# Patient Record
Sex: Female | Born: 1975 | Race: Black or African American | Hispanic: No | Marital: Married | Smoking: Never smoker
Health system: Southern US, Community
[De-identification: ages and names within clinical notes are randomized; demographics above are authoritative.]

## PROBLEM LIST (undated history)

## (undated) ENCOUNTER — Inpatient Hospital Stay (HOSPITAL_COMMUNITY): Payer: Self-pay

## (undated) DIAGNOSIS — O134 Gestational [pregnancy-induced] hypertension without significant proteinuria, complicating childbirth: Secondary | ICD-10-CM

## (undated) DIAGNOSIS — Z789 Other specified health status: Secondary | ICD-10-CM

## (undated) DIAGNOSIS — B009 Herpesviral infection, unspecified: Secondary | ICD-10-CM

## (undated) DIAGNOSIS — N809 Endometriosis, unspecified: Secondary | ICD-10-CM

## (undated) HISTORY — PX: LAPAROSCOPY: SHX197

## (undated) HISTORY — PX: HYSTEROSCOPY: SHX211

---

## 2005-08-15 ENCOUNTER — Other Ambulatory Visit: Admission: RE | Admit: 2005-08-15 | Discharge: 2005-08-15 | Payer: Self-pay | Admitting: Obstetrics and Gynecology

## 2005-09-06 ENCOUNTER — Encounter (INDEPENDENT_AMBULATORY_CARE_PROVIDER_SITE_OTHER): Payer: Self-pay | Admitting: *Deleted

## 2005-09-06 ENCOUNTER — Ambulatory Visit (HOSPITAL_COMMUNITY): Admission: RE | Admit: 2005-09-06 | Discharge: 2005-09-06 | Payer: Self-pay | Admitting: Obstetrics and Gynecology

## 2010-07-13 ENCOUNTER — Ambulatory Visit (HOSPITAL_COMMUNITY)
Admission: RE | Admit: 2010-07-13 | Discharge: 2010-07-13 | Payer: Self-pay | Source: Home / Self Care | Attending: Obstetrics and Gynecology | Admitting: Obstetrics and Gynecology

## 2010-12-01 NOTE — Op Note (Signed)
Meagan Davis, Meagan Davis              ACCOUNT NO.:  [id number]   MEDICAL RECORD NO.:  [id number]          PATIENT TYPE:  AMB   LOCATION:  SDC                           FACILITY:  [facility]   PHYSICIAN:  Carrington Clamp, M.D. DATE OF BIRTH:  Mar 01, 1976   DATE OF PROCEDURE:  09/06/2005  DATE OF DISCHARGE:                                 OPERATIVE REPORT   PREOPERATIVE DIAGNOSIS:  Dysmenorrhea and menorrhagia.   POSTOPERATIVE DIAGNOSES:  1.  Stage IV endometriosis.  2.  Large endometrioma.   PROCEDURE:  Diagnostic laparoscopy, lysis of adhesions and removal of  endometrioma.   SURGEON:  Carrington Clamp, M.D.   ASSISTANT:  Randye Lobo, M.D.   ANESTHESIA:  General.   SPECIMENS:  Endometrioma.   ESTIMATED BLOOD LOSS:  Minimal.   INTRAVENOUS FLUIDS:  ________________   URINE OUTPUT:  Not measured.   COMPLICATIONS:  None.   FINDINGS:  Stage IV endometriosis with lesions as follows.  On entry with  the scope, a large 6 cm endometrioma was seen, and this was identified as  coming from the left paratubal region.  This was ruptured during the  procedure, and chocolate cyst fluid emptied from it. There were filmy and  fibrous adhesions of the left tube to the posterior fundus of the uterus.  The cul-de-sac was initially completely obliterated with small endometriotic  cysts, fibrous and filmy adhesions and frank endometriosis.  There was a  small 2 mm spot of endometriosis and clear plaques seen in the midportion of  the appendix but no other abnormalities of the appendix.  The liver edge was  normal.  Dissection of the cul-de-sac revealed the ovaries stuck bilaterally  to the broad ligament but otherwise appearing normal with the exception of a  small 3 cm solid cyst seen on the right ovary that was consistent with a  dermoid but otherwise appeared normal.  The dissection of the cul-de-sac  revealed a large amount of scar tissue in the bowel was out of the field of  dissection.   Blunt dissection took place along the posterior edge of the  uterus and cervix.  The ureters were seen coursing bilaterally and out of  the field of dissection.  There was even endometriotic plaques and cyst seen  on the left portion of the anterior cul-de-sac.  The ends of the tubes were  unable to be seen as they were frankly adhesed, but there were closest to  the bowel and it was decided not to probe further.   DESCRIPTION OF PROCEDURE:  After adequate general anesthesia was achieved,  the patient prepped and draped in a sterile fashion in dorsal lithotomy  position.  Speculum was placed in the vagina and after the cervix was  dilated with a dilator, the uterine manipulator was placed in the uterus.  The 10 mm incision was made infraumbilically and the Veress needle passed  into the abdomen without aspiration of bowel contents or blood.  The abdomen  was insufflated and the 10 mm trocar placed without complication.  After the  scope had been placed, two 5 mm trocars bilaterally  to the insertion of the  round ligament.  Careful detailed inspection was performed, and the above  findings were noted.  The endometrioma was grasped and emptied of its  contents.  It was then able to be removed with triple polar cautery as it  was well out of the field of dissection.  Blunt dissection was used to  expose the cul-de-sac along the posterior fundus and cervix.  The ovaries  were unable to be mobilized but were able to be inspected.  Irrigation was  performed and although there were some raw surfaces in the dissection of the  cul-de-sac, there was no frank bleeding. After all the findings had been  noted a piece of Interceed was placed in the cul-de-sac over the ovaries to  not only aid with bleeding but also discourage formation of adhesions.  All  instruments were then withdrawn from the abdomen.  The abdomen desufflated.  The 10 mm trocar fascia was closed with a figure-of-eight stitch of 2-0   Vicryl.  The 5 mm subcutaneous incisions were closed with through-and-  through stitch of 3-0 Vicryl and Rapide.  The skin was closed with  Dermabond.  All instruments were withdrawn for the vagina.  The patient  tolerated the procedure well.  She returned to the recovery room in stable  condition.      Carrington Clamp, M.D.  Electronically Signed     MH/MEDQ  D:  09/06/2005  T:  09/07/2005  Job:  [id number]

## 2011-06-20 ENCOUNTER — Other Ambulatory Visit: Payer: Self-pay | Admitting: Obstetrics and Gynecology

## 2011-06-20 DIAGNOSIS — M542 Cervicalgia: Secondary | ICD-10-CM

## 2011-06-20 DIAGNOSIS — M25519 Pain in unspecified shoulder: Secondary | ICD-10-CM

## 2011-06-23 ENCOUNTER — Other Ambulatory Visit: Payer: Self-pay

## 2013-12-02 ENCOUNTER — Other Ambulatory Visit: Payer: Self-pay | Admitting: Obstetrics and Gynecology

## 2016-07-20 ENCOUNTER — Other Ambulatory Visit: Payer: Self-pay | Admitting: Obstetrics and Gynecology

## 2016-07-24 LAB — CYTOLOGY - PAP

## 2017-07-16 HISTORY — PX: MYOMECTOMY: SHX85

## 2018-05-12 ENCOUNTER — Inpatient Hospital Stay (HOSPITAL_COMMUNITY)
Admission: AD | Admit: 2018-05-12 | Discharge: 2018-05-12 | Disposition: A | Discharge status: Home / Self Care | Payer: BLUE CROSS/BLUE SHIELD | Source: Ambulatory Visit | Attending: Obstetrics | Admitting: Obstetrics

## 2018-05-12 ENCOUNTER — Encounter (HOSPITAL_COMMUNITY): Payer: Self-pay | Admitting: *Deleted

## 2018-05-12 ENCOUNTER — Other Ambulatory Visit: Payer: Self-pay

## 2018-05-12 ENCOUNTER — Inpatient Hospital Stay (HOSPITAL_BASED_OUTPATIENT_CLINIC_OR_DEPARTMENT_OTHER): Payer: BLUE CROSS/BLUE SHIELD

## 2018-05-12 DIAGNOSIS — O3412 Maternal care for benign tumor of corpus uteri, second trimester: Secondary | ICD-10-CM | POA: Insufficient documentation

## 2018-05-12 DIAGNOSIS — D259 Leiomyoma of uterus, unspecified: Secondary | ICD-10-CM | POA: Insufficient documentation

## 2018-05-12 DIAGNOSIS — O418X20 Other specified disorders of amniotic fluid and membranes, second trimester, not applicable or unspecified: Secondary | ICD-10-CM | POA: Diagnosis not present

## 2018-05-12 DIAGNOSIS — N809 Endometriosis, unspecified: Secondary | ICD-10-CM | POA: Insufficient documentation

## 2018-05-12 DIAGNOSIS — Z79899 Other long term (current) drug therapy: Secondary | ICD-10-CM | POA: Diagnosis not present

## 2018-05-12 DIAGNOSIS — O4692 Antepartum hemorrhage, unspecified, second trimester: Secondary | ICD-10-CM

## 2018-05-12 DIAGNOSIS — Z3A16 16 weeks gestation of pregnancy: Secondary | ICD-10-CM

## 2018-05-12 DIAGNOSIS — O468X2 Other antepartum hemorrhage, second trimester: Secondary | ICD-10-CM

## 2018-05-12 HISTORY — DX: Endometriosis, unspecified: N80.9

## 2018-05-12 HISTORY — DX: Other specified health status: Z78.9

## 2018-05-12 LAB — CBC
HEMATOCRIT: 33.8 % — AB (ref 36.0–46.0)
HEMOGLOBIN: 11.5 g/dL — AB (ref 12.0–15.0)
MCH: 30.3 pg (ref 26.0–34.0)
MCHC: 34 g/dL (ref 30.0–36.0)
MCV: 89.2 fL (ref 80.0–100.0)
NRBC: 0 % (ref 0.0–0.2)
Platelets: 203 10*3/uL (ref 150–400)
RBC: 3.79 MIL/uL — ABNORMAL LOW (ref 3.87–5.11)
RDW: 16.3 % — AB (ref 11.5–15.5)
WBC: 5.2 10*3/uL (ref 4.0–10.5)

## 2018-05-12 LAB — ABO/RH: ABO/RH(D): O POS

## 2018-05-12 NOTE — MAU Note (Signed)
About 0900, noted some real dark brown, reddish on panties, noting when wiped.  Happened again about 3 wks ago.    Hx of fibroids.  Last Wed, started having pain in her vagina- heaviness or pulling sensation. Called office, was told to come here. No pain

## 2018-05-12 NOTE — MAU Note (Signed)
Urine in lab 

## 2018-05-12 NOTE — Discharge Instructions (Signed)
Pelvic Rest °Pelvic rest may be recommended if: °· Your placenta is partially or completely covering the opening of your cervix (placenta previa). °· There is bleeding between the wall of the uterus and the amniotic sac in the first trimester of pregnancy (subchorionic hemorrhage). °· You went into labor too early (preterm labor). ° °Based on your overall health and the health of your baby, your health care provider will decide if pelvic rest is right for you. °How do I rest my pelvis? °For as long as told by your health care provider: °· Do not have sex, sexual stimulation, or an orgasm. °· Do not use tampons. Do not douche. Do not put anything in your vagina. °· Do not lift anything that is heavier than 10 lb (4.5 kg). °· Avoid activities that take a lot of effort (are strenuous). °· Avoid any activity in which your pelvic muscles could become strained. ° °When should I seek medical care? °Seek medical care if you have: °· Cramping pain in your lower abdomen. °· Vaginal discharge. °· A low, dull backache. °· Regular contractions. °· Uterine tightening. ° °When should I seek immediate medical care? °Seek immediate medical care if: °· You have vaginal bleeding and you are pregnant. ° °This information is not intended to replace advice given to you by your health care provider. Make sure you discuss any questions you have with your health care provider. °Document Released: 10/27/2010 Document Revised: 12/08/2015 Document Reviewed: 01/03/2015 °Elsevier Interactive Patient Education © 2018 Elsevier Inc. ° °Subchorionic Hematoma °A subchorionic hematoma is a gathering of blood between the outer wall of the placenta and the inner wall of the womb (uterus). The placenta is the organ that connects the fetus to the wall of the uterus. The placenta performs the feeding, breathing (oxygen to the fetus), and waste removal (excretory work) of the fetus. °Subchorionic hematoma is the most common abnormality found on a result  from ultrasonography done during the first trimester or early second trimester of pregnancy. If there has been little or no vaginal bleeding, early small hematomas usually shrink on their own and do not affect your baby or pregnancy. The blood is gradually absorbed over 1-2 weeks. When bleeding starts later in pregnancy or the hematoma is larger or occurs in an older pregnant woman, the outcome may not be as good. Larger hematomas may get bigger, which increases the chances for miscarriage. Subchorionic hematoma also increases the risk of premature detachment of the placenta from the uterus, preterm (premature) labor, and stillbirth. °Follow these instructions at home: °· Stay on bed rest if your health care provider recommends this. Although bed rest will not prevent more bleeding or prevent a miscarriage, your health care provider may recommend bed rest until you are advised otherwise. °· Avoid heavy lifting (more than 10 lb [4.5 kg]), exercise, sexual intercourse, or douching as directed by your health care provider. °· Keep track of the number of pads you use each day and how soaked (saturated) they are. Write down this information. °· Do not use tampons. °· Keep all follow-up appointments as directed by your health care provider. Your health care provider may ask you to have follow-up blood tests or ultrasound tests or both. °Get help right away if: °· You have severe cramps in your stomach, back, abdomen, or pelvis. °· You have a fever. °· You pass large clots or tissue. Save any tissue for your health care provider to look at. °· Your bleeding increases or you become lightheaded,   feel weak, or have fainting episodes. °This information is not intended to replace advice given to you by your health care provider. Make sure you discuss any questions you have with your health care provider. °Document Released: 10/17/2006 Document Revised: 12/08/2015 Document Reviewed: 01/29/2013 °Elsevier Interactive Patient  Education © 2017 Elsevier Inc. ° °

## 2018-05-12 NOTE — MAU Provider Note (Signed)
History     CSN: [id number]  Arrival date and time: 05/12/18 1154   First Provider Initiated Contact with Patient 05/12/18 1323      Chief Complaint  Patient presents with  . Vaginal Bleeding   HPI  Ms.Meagan Davis is a 42 y.o. female G1P0 @ [redacted]w[redacted]d with a history of fibroids here in MAU with complaints of vaginal bleeding. This is the second time in the pregnancy she has had bleeding. This episode of vaginal bleeding started today. The bleeding is enough for her to wear a pad, however when she checked the pad it did not have any blood on it. The blood is very dark brown. No clots or active bright red bleeding. Some pelvic pressure related with movement, no pain.   OB History    Gravida  1   Para      Term      Preterm      AB      Living        SAB      TAB      Ectopic      Multiple      Live Births              Past Medical History:  Diagnosis Date  . Endometriosis   . Medical history non-contributory     Past Surgical History:  Procedure Laterality Date  . MYOMECTOMY  2019    Family History  Problem Relation Age of Onset  . Healthy Mother   . Healthy Father     Social History   Tobacco Use  . Smoking status: Never Smoker  . Smokeless tobacco: Never Used  Substance Use Topics  . Alcohol use: Not Currently    Comment: Social  . Drug use: Never    Allergies: No Known Allergies  Medications Prior to Admission  Medication Sig Dispense Refill Last Dose  . docusate sodium (COLACE) 100 MG capsule Take 100 mg by mouth daily.   05/11/2018 at Unknown time  . Prenatal Vit-Fe Fumarate-FA (PRENATAL MULTIVITAMIN) TABS tablet Take 1 tablet by mouth daily at 12 noon.   05/12/2018 at Unknown time   Results for orders placed or performed during the hospital encounter of 05/12/18 (from the past 48 hour(s))  ABO/Rh     Status: None   Collection Time: 05/12/18  1:35 PM  Result Value Ref Range   ABO/RH(D)      O POS Performed at [facility],  [address], [place], [place] [address]   CBC     Status: Abnormal   Collection Time: 05/12/18  1:35 PM  Result Value Ref Range   WBC 5.2 4.0 - 10.5 K/uL   RBC 3.79 (L) 3.87 - 5.11 MIL/uL   Hemoglobin 11.5 (L) 12.0 - 15.0 g/dL   HCT 33.8 (L) 36.0 - 46.0 %   MCV 89.2 80.0 - 100.0 fL   MCH 30.3 26.0 - 34.0 pg   MCHC 34.0 30.0 - 36.0 g/dL   RDW 16.3 (H) 11.5 - 15.5 %   Platelets 203 150 - 400 K/uL   nRBC 0.0 0.0 - 0.2 %    Comment: Performed at [facility], [address] [place], [place] [address]    [place] Mfm Ob Limited  Result Date: 05/12/2018 ----------------------------------------------------------------------  OBSTETRICS REPORT                       (Signed Final 05/12/2018 03:30 pm) ---------------------------------------------------------------------- Patient Info  ID #:  [id number]                          D.O.B.:  [birth year] (42 yrs)  Name:       Meagan Davis                    Visit Date: 05/12/2018 02:56 pm ---------------------------------------------------------------------- Performed By  Performed By:     Novella Rob        Ref. Address:     [address] [place],                                                             [place] [address]  Attending:        Tama High MD        Location:         [facility]  Referred By:      Lezlie Lye NP ---------------------------------------------------------------------- Orders   #  Description                          Code         Ordered By   1  [place] MFM OB LIMITED                    [phone]     [facility]  ----------------------------------------------------------------------   #  Order #                    Accession #                 Episode #   1  [id number]                   [id number]                  [id number]  ---------------------------------------------------------------------- Indications   Vaginal  bleeding in pregnancy, second          O46.92   trimester (dark brown on panties, nothing   when wiped)   Uterine fibroids affecting pregnancy in        O34.12, D25.9   second trimester, antepartum   [redacted] weeks gestation of pregnancy                Z3A.16  ---------------------------------------------------------------------- Vital Signs  Weight (lb): 255 ---------------------------------------------------------------------- Fetal Evaluation  Num Of Fetuses:         1  Fetal Heart Rate(bpm):  144  Cardiac Activity:       Observed  Presentation:           Cephalic  Placenta:  Anterior  Amniotic Fluid  AFI FV:      Within normal limits                              Largest Pocket(cm)                              4.21  Comment:    Small subchorionic hemorrhage noted. ---------------------------------------------------------------------- OB History  Gravidity:    1 ---------------------------------------------------------------------- Gestational Age  Best:          16w 4d     Det. By:  Previous Ultrasound      EDD:   10/23/18 ---------------------------------------------------------------------- Cervix Uterus Adnexa  Uterus  Multiple fibroids noted, see table below.  Left Ovary  Not visualized. No adnexal mass visualized.  Right Ovary  Not visualized. No adnexal mass visualized.  Cul De Sac  No free fluid seen.  Adnexa  No abnormality visualized. ---------------------------------------------------------------------- Myomas   Site                     L(cm)      W(cm)      D(cm)      Location   LUS                      7.64       6.72       4.78   Posterior                7.74       5.49       4   Right                    5.23       2.75       3.29  ----------------------------------------------------------------------   Blood Flow                 RI        PI       Comments  ---------------------------------------------------------------------- Impression  Patient was evaluated for c/o vaginal bleeding.  A  limited ultrasound study was performed. Amniotic fluid is  normal and good fetal activity is seen. 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  A small subchorionic hematoma is seen at the placental edge  (fundal).  The cervix could not be measured, but no funneling is seen. ----------------------------------------------------------------------                  Tama High, MD Electronically Signed Final Report   05/12/2018 03:30 pm ----------------------------------------------------------------------  Review of Systems  Gastrointestinal: Positive for abdominal pain (Pelvic pressure ).  Genitourinary: Positive for vaginal pain.   Physical Exam   Blood pressure 130/88, pulse 85, temperature 98.2 F (36.8 C), temperature source Oral, resp. rate 17, weight 115.8 kg, SpO2 99 %.  Physical Exam  Constitutional: She is oriented to person, place, and time. She appears well-developed and well-nourished. No distress.  HENT:  Head: Normocephalic.  Eyes: Pupils are equal, round, and reactive to light.  Genitourinary:  Genitourinary Comments: Vagina - Small amount of dark brown vaginal discharge Cervix - No contact bleeding, no active bleeding  Bimanual exam: Cervix closed, firm Chaperone present for exam.   Musculoskeletal: Normal range of motion.  Neurological: She is alert and oriented to person, place, and time.  Skin: Skin is warm. She is not diaphoretic.  Psychiatric: Her behavior is normal.   MAU Course  Procedures  None  MDM  + fetal heart tones via doppler O positive blood type Limited US shows a small subchorionic hemorrhage; discussed this in detail with the patient and her significant other. Questions answered The office was called to confirm f/u on Nov 8 @ 1400  Assessment and Plan   A:  1. Vaginal bleeding in pregnancy, second trimester   2. [redacted] weeks gestation of pregnancy   3. Subchorionic hematoma in second trimester, single or unspecified fetus     P:  Discharge home in stable  condition, with strict return precautions The patient is scheduled for f/u on Nov 8 in the office at 1400; I confirmed this with the office staff at [facility] The patient was informed she is to return to MAU if her bleeding worsens or changes  Pelvic rest Call the office sooner if needed Information was given to the patient about subchorionic hemorrhage.   Lezlie Lye, NP 05/12/2018 4:24 PM

## 2018-05-14 ENCOUNTER — Ambulatory Visit: Payer: BLUE CROSS/BLUE SHIELD

## 2018-05-23 ENCOUNTER — Inpatient Hospital Stay (HOSPITAL_COMMUNITY)
Admission: AD | Admit: 2018-05-23 | Discharge: 2018-05-24 | Disposition: A | Discharge status: Home / Self Care | Payer: BLUE CROSS/BLUE SHIELD | Source: Ambulatory Visit | Attending: Obstetrics and Gynecology | Admitting: Obstetrics and Gynecology

## 2018-05-23 ENCOUNTER — Encounter (HOSPITAL_COMMUNITY): Payer: Self-pay | Admitting: *Deleted

## 2018-05-23 DIAGNOSIS — Z79899 Other long term (current) drug therapy: Secondary | ICD-10-CM | POA: Insufficient documentation

## 2018-05-23 DIAGNOSIS — O26872 Cervical shortening, second trimester: Secondary | ICD-10-CM | POA: Insufficient documentation

## 2018-05-23 DIAGNOSIS — K219 Gastro-esophageal reflux disease without esophagitis: Secondary | ICD-10-CM | POA: Diagnosis not present

## 2018-05-23 DIAGNOSIS — O3432 Maternal care for cervical incompetence, second trimester: Secondary | ICD-10-CM | POA: Insufficient documentation

## 2018-05-23 DIAGNOSIS — D649 Anemia, unspecified: Secondary | ICD-10-CM | POA: Diagnosis not present

## 2018-05-23 DIAGNOSIS — O99012 Anemia complicating pregnancy, second trimester: Secondary | ICD-10-CM | POA: Diagnosis not present

## 2018-05-23 DIAGNOSIS — Z3A18 18 weeks gestation of pregnancy: Secondary | ICD-10-CM | POA: Diagnosis not present

## 2018-05-23 DIAGNOSIS — O99612 Diseases of the digestive system complicating pregnancy, second trimester: Secondary | ICD-10-CM | POA: Insufficient documentation

## 2018-05-23 DIAGNOSIS — O09522 Supervision of elderly multigravida, second trimester: Secondary | ICD-10-CM | POA: Insufficient documentation

## 2018-05-23 DIAGNOSIS — O99212 Obesity complicating pregnancy, second trimester: Secondary | ICD-10-CM | POA: Diagnosis not present

## 2018-05-23 LAB — CBC WITH DIFFERENTIAL/PLATELET
BASOS ABS: 0 10*3/uL (ref 0.0–0.1)
Basophils Relative: 0 %
EOS ABS: 0.1 10*3/uL (ref 0.0–0.5)
EOS PCT: 1 %
HCT: 33 % — ABNORMAL LOW (ref 36.0–46.0)
HEMOGLOBIN: 11 g/dL — AB (ref 12.0–15.0)
Lymphocytes Relative: 25 %
Lymphs Abs: 1.4 10*3/uL (ref 0.7–4.0)
MCH: 30.7 pg (ref 26.0–34.0)
MCHC: 33.3 g/dL (ref 30.0–36.0)
MCV: 92.2 fL (ref 80.0–100.0)
Monocytes Absolute: 0.3 10*3/uL (ref 0.1–1.0)
Monocytes Relative: 6 %
NRBC: 0 % (ref 0.0–0.2)
Neutro Abs: 3.8 10*3/uL (ref 1.7–7.7)
Neutrophils Relative %: 68 %
PLATELETS: 193 10*3/uL (ref 150–400)
RBC: 3.58 MIL/uL — AB (ref 3.87–5.11)
RDW: 15.7 % — ABNORMAL HIGH (ref 11.5–15.5)
WBC: 5.6 10*3/uL (ref 4.0–10.5)

## 2018-05-23 LAB — ABO/RH: ABO/RH(D): O POS

## 2018-05-23 MED ORDER — DOCUSATE SODIUM 100 MG PO CAPS
100.0000 mg | ORAL_CAPSULE | Freq: Two times a day (BID) | ORAL | Status: DC | PRN
  Administered 2018-05-23: 100 mg via ORAL
  Filled 2018-05-23: qty 1

## 2018-05-23 MED ORDER — FAMOTIDINE 20 MG PO TABS
10.0000 mg | ORAL_TABLET | Freq: Every day | ORAL | Status: DC
  Administered 2018-05-23: 10 mg via ORAL
  Filled 2018-05-23: qty 1

## 2018-05-23 NOTE — H&P (Signed)
42 y.o. G1P0 @18 .1 presents from office after anatomy scan today showed short cervix; c/o pelvic pressure; no LOF; + flutters. On [place]: s=d, anatomy seen normal but heart details not seen; cervix dynamic and short from 0.7 cm to 1 cm. Pt seen on 10-28 for bleeding but cervix not seen and no vaginal length done. Small [place] seen but pt sent home. Pressure with RLP but nothing like menstrual cramps. No bleeding since 10-28. Taking colace and not straining. Known fibroids are not involved in cervix.   PMH/SurgHx: constipation, no DM or HTN PSH: Multiple laparoscopies and myomectomies; removal of endometriosis. One Hysteroscopy turned into ex-lap and myomectomy with entry into EM- PT MUST HAVE DELIVERY BY C/S AT 37 WEEKS.   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 No abnormal paps.  Pobx: none This pregnancy is complicated by IVF with own egg but MaterniT 21 was normal. Fibroids (but not involving cervix). Anatomy exam normal but heart details not complete today. SSE exam no bulging membranes but some scant old blood. SVE not done. Pt not yet started on baby ASA (AMA and African American.) Pt has not has progesterone since early pregnancy for IVF.  Past Medical History:  Diagnosis Date  . Endometriosis   . Medical history non-contributory   see above  Past Surgical History:  Procedure Laterality Date  . MYOMECTOMY  2019   see above Social History   Socioeconomic History  . Marital status: Married    Spouse name: Not on file  . Number of children: Not on file  . Years of education: Not on file  . Highest education level: Not on file  Occupational History  . Not on file  Social Needs  . Financial resource strain: Not on file  . Food insecurity:    Worry: Not on file    Inability: Not on file  . Transportation needs:    Medical: Not on file    Non-medical: Not on file  Tobacco Use  . Smoking status: Never Smoker  . Smokeless tobacco: Never Used  Substance and Sexual Activity  .  Alcohol use: Not Currently    Comment: Social  . Drug use: Never  . Sexual activity: Not Currently  Lifestyle  . Physical activity:    Days per week: Not on file    Minutes per session: Not on file  . Stress: Not on file  Relationships  . Social connections:    Talks on phone: Not on file    Gets together: Not on file    Attends religious service: Not on file    Active member of club or organization: Not on file    Attends meetings of clubs or organizations: Not on file    Relationship status: Not on file  . Intimate partner violence:    Fear of current or ex partner: Not on file    Emotionally abused: Not on file    Physically abused: Not on file    Forced sexual activity: Not on file  Other Topics Concern  . Not on file  Social History Narrative  . Not on file    No current facility-administered medications on file prior to encounter.    Current Outpatient Medications on File Prior to Encounter  Medication Sig Dispense Refill  . docusate sodium (COLACE) 100 MG capsule Take 100 mg by mouth daily.    . famotidine (PEPCID) 20 MG tablet Take 20 mg by mouth daily.    . Prenatal Vit-Fe Fumarate-FA (  PRENATAL MULTIVITAMIN) TABS tablet Take 1 tablet by mouth daily at 12 noon.      No Known Allergies  Vitals:   05/23/18 1626 05/23/18 1654 05/23/18 1949  BP: 123/83  127/90  Pulse: 98  86  Resp: 19  17  Temp: 98.5 F (36.9 C)  98.5 F (36.9 C)  TempSrc: Oral  Oral  SpO2: 99%  98%  Weight:  115.7 kg   Height:  5' 5.75" (1.67 m)    Gen: Abdomen:  soft, nontender, nondistended. Ex:  no cords, erythema Pelvic:  Deferred- see above for office exam  A:  Admitted for short cervix P:  Plan for rescue cerclage, scheduled tomorrow and to be performed by MFM Dr. Donalee Citrin. Discussed today with Dr. Donalee Citrin and [place] images from today's exam reviewed.   1) Type and screen 2) NPO after midnight 3) SDCs 4) FHT q shift 5) Colace and Pepcid ordered 6)Other routine care.  Allyn Kenner

## 2018-05-24 ENCOUNTER — Inpatient Hospital Stay (HOSPITAL_COMMUNITY): Payer: BLUE CROSS/BLUE SHIELD | Admitting: Anesthesiology

## 2018-05-24 ENCOUNTER — Encounter (HOSPITAL_COMMUNITY)
Admission: AD | Disposition: A | Discharge status: Home / Self Care | Payer: Self-pay | Source: Ambulatory Visit | Attending: Obstetrics and Gynecology

## 2018-05-24 ENCOUNTER — Encounter (HOSPITAL_COMMUNITY): Payer: Self-pay | Admitting: Anesthesiology

## 2018-05-24 ENCOUNTER — Other Ambulatory Visit: Payer: Self-pay

## 2018-05-24 DIAGNOSIS — O3432 Maternal care for cervical incompetence, second trimester: Secondary | ICD-10-CM | POA: Diagnosis not present

## 2018-05-24 HISTORY — PX: CERVICAL CERCLAGE: SHX1329

## 2018-05-24 LAB — CBC
HCT: 34.6 % — ABNORMAL LOW (ref 36.0–46.0)
Hemoglobin: 11.6 g/dL — ABNORMAL LOW (ref 12.0–15.0)
MCH: 30.8 pg (ref 26.0–34.0)
MCHC: 33.5 g/dL (ref 30.0–36.0)
MCV: 91.8 fL (ref 80.0–100.0)
PLATELETS: 209 10*3/uL (ref 150–400)
RBC: 3.77 MIL/uL — ABNORMAL LOW (ref 3.87–5.11)
RDW: 15.9 % — ABNORMAL HIGH (ref 11.5–15.5)
WBC: 5.1 10*3/uL (ref 4.0–10.5)
nRBC: 0 % (ref 0.0–0.2)

## 2018-05-24 LAB — TYPE AND SCREEN
ABO/RH(D): O POS
Antibody Screen: NEGATIVE

## 2018-05-24 SURGERY — CERCLAGE, CERVIX, VAGINAL APPROACH
Anesthesia: Spinal

## 2018-05-24 MED ORDER — FENTANYL CITRATE (PF) 100 MCG/2ML IJ SOLN: 25.0000 ug | INTRAMUSCULAR | Status: DC | PRN

## 2018-05-24 MED ORDER — HYDROCODONE-ACETAMINOPHEN 7.5-325 MG PO TABS: 1.0000 | ORAL_TABLET | Freq: Once | ORAL | Status: DC | PRN

## 2018-05-24 MED ORDER — ACETAMINOPHEN 325 MG PO TABS
650.0000 mg | ORAL_TABLET | Freq: Four times a day (QID) | ORAL | Status: DC | PRN
  Administered 2018-05-24: 650 mg via ORAL
  Filled 2018-05-24: qty 2

## 2018-05-24 MED ORDER — BUPIVACAINE IN DEXTROSE 0.75-8.25 % IT SOLN
INTRATHECAL | Status: DC | PRN
  Administered 2018-05-24: 1.2 mL via INTRATHECAL

## 2018-05-24 MED ORDER — MEPERIDINE HCL 25 MG/ML IJ SOLN: 6.2500 mg | INTRAMUSCULAR | Status: DC | PRN

## 2018-05-24 MED ORDER — ONDANSETRON HCL 4 MG/2ML IJ SOLN: 4.0000 mg | Freq: Once | INTRAMUSCULAR | Status: DC | PRN

## 2018-05-24 MED ORDER — PHENYLEPHRINE 8 MG IN D5W 100 ML (0.08MG/ML) PREMIX OPTIME
INJECTION | INTRAVENOUS | Status: DC | PRN
  Administered 2018-05-24: 30 ug/min via INTRAVENOUS

## 2018-05-24 MED ORDER — LACTATED RINGERS IV SOLN
INTRAVENOUS | Status: DC | PRN
  Administered 2018-05-24: 11:00:00 via INTRAVENOUS

## 2018-05-24 SURGICAL SUPPLY — 20 items
CANISTER SUCT 3000ML PPV (MISCELLANEOUS) ×3 IMPLANT
ELECT REM PT RETURN 9FT ADLT (ELECTROSURGICAL) ×3
ELECTRODE REM PT RTRN 9FT ADLT (ELECTROSURGICAL) ×1 IMPLANT
GLOVE BIO SURGEON STRL SZ7.5 (GLOVE) ×6 IMPLANT
GLOVE BIOGEL PI IND STRL 8 (GLOVE) ×3 IMPLANT
GLOVE BIOGEL PI INDICATOR 8 (GLOVE) ×6
GOWN STRL REUS W/TWL LRG LVL3 (GOWN DISPOSABLE) ×9 IMPLANT
NS IRRIG 1000ML POUR BTL (IV SOLUTION) ×3 IMPLANT
PACK VAGINAL MINOR WOMEN LF (CUSTOM PROCEDURE TRAY) ×3 IMPLANT
PAD OB MATERNITY 4.3X12.25 (PERSONAL CARE ITEMS) ×3 IMPLANT
PAD PREP 24X48 CUFFED NSTRL (MISCELLANEOUS) ×3 IMPLANT
PENCIL BUTTON HOLSTER BLD 10FT (ELECTRODE) ×6 IMPLANT
SUT MERSILENE FIBER S 5 MO-4 1 (SUTURE) ×3 IMPLANT
SUT VIC AB 3-0 SH 27 (SUTURE) ×2
SUT VIC AB 3-0 SH 27X BRD (SUTURE) ×1 IMPLANT
SYR BULB IRRIGATION 50ML (SYRINGE) ×3 IMPLANT
TOWEL NATURAL 6PK STERILE (DISPOSABLE) ×3 IMPLANT
TUBING NON-CON 1/4 X 20 CONN (TUBING) ×2 IMPLANT
TUBING NON-CON 1/4 X 20' CONN (TUBING) ×1
YANKAUER SUCT BULB TIP NO VENT (SUCTIONS) ×3 IMPLANT

## 2018-05-24 NOTE — Progress Notes (Signed)
Pt returned from OR post cerclage placement. Pt denies the need for pain medicine. Toya Smothers, RN

## 2018-05-24 NOTE — Transfer of Care (Signed)
Immediate Anesthesia Transfer of Care Note  Patient: Freya Cuddeback  Procedure(s) Performed: CERCLAGE CERVICAL (N/A )  Patient Location: PACU  Anesthesia Type:Spinal  Level of Consciousness: awake, alert  and oriented  Airway & Oxygen Therapy: Patient Spontanous Breathing  Post-op Assessment: Report given to RN and Post -op Vital signs reviewed and stable  Post vital signs: Reviewed and stable  Last Vitals:  Vitals Value Taken Time  BP    Temp    Pulse    Resp    SpO2      Last Pain:  Vitals:   05/24/18 0800  TempSrc:   PainSc: 0-No pain         Complications: No apparent anesthesia complications

## 2018-05-24 NOTE — Anesthesia Preprocedure Evaluation (Signed)
Anesthesia Evaluation  Patient identified by MRN, date of birth, ID band Patient awake    Reviewed: Allergy & Precautions, NPO status , Patient's Chart, lab work & pertinent test results  Airway Mallampati: III  TM Distance: >3 FB Neck ROM: Full    Dental no notable dental hx. (+) Teeth Intact   Pulmonary neg pulmonary ROS,    Pulmonary exam normal breath sounds clear to auscultation       Cardiovascular negative cardio ROS Normal cardiovascular exam Rhythm:Regular Rate:Normal     Neuro/Psych negative neurological ROS  negative psych ROS   GI/Hepatic Neg liver ROS, GERD  Medicated and Controlled,  Endo/Other  Morbid obesity  Renal/GU negative Renal ROS  negative genitourinary   Musculoskeletal negative musculoskeletal ROS (+)   Abdominal (+) + obese,   Peds  Hematology  (+) anemia ,   Anesthesia Other Findings   Reproductive/Obstetrics (+) Pregnancy Short ( incompetent ) cervix AMA                             Anesthesia Physical Anesthesia Plan  ASA: III  Anesthesia Plan: Spinal   Post-op Pain Management:    Induction:   PONV Risk Score and Plan: 4 or greater and Scopolamine patch - Pre-op, Ondansetron, Dexamethasone and Treatment may vary due to age or medical condition  Airway Management Planned: Natural Airway, Nasal Cannula and Simple Face Mask  Additional Equipment:   Intra-op Plan:   Post-operative Plan:   Informed Consent: I have reviewed the patients History and Physical, chart, labs and discussed the procedure including the risks, benefits and alternatives for the proposed anesthesia with the patient or authorized representative who has indicated his/her understanding and acceptance.   Dental advisory given  Plan Discussed with: CRNA and Surgeon  Anesthesia Plan Comments:         Anesthesia Quick Evaluation

## 2018-05-24 NOTE — Progress Notes (Signed)
Pt stable overnight, no complaints, no changes or concerns. Labs: O+, Hb 11.6, WBC 5.1 Will plan to proceed as planned for cerclage placement this morning to be performed by Dr. Donalee Citrin, I will assist.

## 2018-05-24 NOTE — Anesthesia Procedure Notes (Signed)
Spinal  Patient location during procedure: OR Start time: 05/24/2018 10:57 AM End time: 05/24/2018 11:00 AM Staffing Anesthesiologist: Josephine Igo, MD Performed: anesthesiologist  Preanesthetic Checklist Completed: patient identified, site marked, surgical consent, pre-op evaluation, timeout performed, IV checked, risks and benefits discussed and monitors and equipment checked Spinal Block Patient position: sitting Prep: site prepped and draped and DuraPrep Patient monitoring: heart rate, cardiac monitor, continuous pulse ox and blood pressure Approach: midline Location: L3-4 Injection technique: single-shot Needle Needle type: Pencan  Needle gauge: 24 G Needle length: 9 cm Needle insertion depth: 7 cm Assessment Sensory level: T6 Additional Notes Patient tolerated procedure well. Adequate sensory level.

## 2018-05-24 NOTE — Anesthesia Postprocedure Evaluation (Signed)
Anesthesia Post Note  Patient: Meagan Davis  Procedure(s) Performed: CERCLAGE CERVICAL (N/A )     Patient location during evaluation: PACU Anesthesia Type: Spinal Level of consciousness: oriented and awake and alert Pain management: pain level controlled Vital Signs Assessment: post-procedure vital signs reviewed and stable Respiratory status: spontaneous breathing, respiratory function stable and nonlabored ventilation Cardiovascular status: blood pressure returned to baseline and stable Postop Assessment: no headache, no backache, no apparent nausea or vomiting and spinal receding Anesthetic complications: no    Last Vitals:  Vitals:   05/24/18 1215 05/24/18 1230  BP: 128/81 132/90  Pulse: 90 82  Resp: (!) 26 15  Temp:    SpO2: 100% 100%    Last Pain:  Vitals:   05/24/18 1230  TempSrc:   PainSc: 0-No pain   Pain Goal:                 Quinlan Mcfall A.

## 2018-05-24 NOTE — Discharge Summary (Signed)
  Pt presented from office with short cervix noted on anatomy scan.  Dr. Philis Pique discussed with MFM Dr. Donalee Citrin and patient and plan was to proceed with rescue cerclage.  As pt had large meal in the afternoon she was schedule for this morning.  Cerclage was placed, it was a challenging cerclage per Dr. Donalee Citrin d/t deficient posterior cervix.  Pt tolerated the procedure well and will be discharged home after PACU if continues to do well.  She will f/u with MFM 2 weeks for [place] and see Dr. Philis Pique in office this week.

## 2018-05-24 NOTE — Consult Note (Signed)
Maternal-Fetal Medicine  Name: Meagan Davis MRN: [id number] Requesting Provider: Allyn Kenner, DO  I had the pleasure of seeing Meagan Davis, who is admitted for rescue cerclage. She is a G1 P0 at 18w 2d gestation admitted with the diagnosis of cervical incompetence.   Patient had routine prenatal visit at your office yesterday. Fetal anatomical survey was performed (normal) and transvaginal scan showed cervical shortening that measured between 7 to 10 millimeters with dilated proximal portion of cervical canal. Vaginal examination performed by Dr. Philis Pique showed closed external os (and "old blood"). Patient had 2 episodes of slight vaginal bleeding in this pregnancy. Most-recently, she was evaluated in the MAU on 05/12/18 with vaginal bleeding. A small subchorionic hematoma was seen near the fundal edge of the placenta. Patient did not have further bleeding.  She does not have uterine cramping, but reports frequent visits to the bathroom and lower abdominal "round ligament pain" laterally.  Gyn: IVF-embryo transfer pregnancy. Patient had laparoscopic myomectomy and laparoscopies for other indications (endometriosis). No history of abnormal Pap smears or cervical surgeries. 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 No history of recent STIs.  PMH: No history of hypertension or diabetes or any other chronic medical conditions. Allergies: NKDA. Social: Denies tobacco or drug or alcohol use. Works in a bank. Her partner is an Sales promotion account executive and is in good health. Family: No history of venous thromboembolism in the family.  P/E: Patient is comfortably sitting up; not in pain. Vitals stable. HEENT: Normal. Abd: Soft; no tenderness; no flank tenderness.  Labs: Hb 11.6, Hct 34.6, PLT 209, WBC 5.1.  I counseled the couple on the following: Cervical insufficiency: Explained the diagnosis with help of a diagram. Correct diagnosis is made only on ultrasound. Since the patient  has not had previous pregnancies, the diagnosis of cervical insufficiency is more likely. I informed her that it is also possible that early pregnancy bleeding could have contributed to the condition.  Short cervix is associated with increased risk of miscarriage or preterm delivery.  Discussed the options including: a) vaginal progesterone treatment daily till 36 weeks. In women who have not had preterm deliveries or mid-trimester spontaneous pregnancy losses, progesterone reduces the likelihood of preterm deliveries or b) cerclage. I explained the procedure and possible complications including miscarriage, infection, bleeding, and injuries to bladder or bowel (rare).  Cerclage may be a better option if the cervical length is below 10 millimeters, but no clear studies are available. I also informed her that her risk of miscarriage or preterm delivery is increased because of her history of bleeding and the presence of myomas and the risks are independent of rescue cerclage procedure.  After counseling, the patient opted and is firm in her choice of rescue cerclage.  I discussed postoperative symptoms including pain and recovery.  Informed consent was obtained.  Thank you for your consult. Please do not hesitate to contact me if you have any questions or concerns.  Consultation including face-to-face counseling: 40 min.

## 2018-05-24 NOTE — Op Note (Signed)
Operative Note  Preop Diagnosis: 18-weeks' gestation with cervical insufficiency Postop Diagnosis: Same with cerclage. Procedure: Rescue cerclage   Surgeon: Dr. Tama High, MD Surgical Assistant: Dr. Everardo Beals, DO Assitant: Osvaldo Angst, Tech  Anesthesiologist: Dr. Royce Macadamia  Findings: Cervix 1 cm long and the external os is closed. Posteriorly, the cervical lip was deficient.   After informed consent, the patient was brought to the OR. Spinal anesthesia was administered by anesthesiologist. Patient was prepped and draped. A "Time-out" was performed to identifiy the patient.  A weighted speculum was inserted into the vagina. A bivalve speculum was inserted and anterior vaginal wall was retracted to visualize the cervix. Anterior lip of the cervix was grasped with ring forceps and a small 2 cm incision was made with Bovie at the cervico-vesical junction and the bladder was reflected upward. A [place] 5 stitch was placed circumferentially starting at 11 O'clock position and tied anteriorly (5 knots). Posteriorly, the cervix was deficient and the posterior stitch could be closer to the external os. Vaginal examination confirmed circumferential suture in place. Cervix was inspected and no bleeding was seen. No tears were seen on the lateral vaginal walls.Lateral vaginal wall retractors were used to facilitate clear visualization.  A small 1 cm tear near the introitus on the right side was found to be bleeding and 3/0 chromic mattress sutures (2) were placed. Complete hemostasis was achieved.   All instruments were removed and count was correct. Bladder was drained with a red rubber catheter just to rule out hematuria. About 5 mL of clear urine was drained. Rectal examination was performed and no inadvertent placement of sutures was felt.  Patient left the OR in good condition. I briefly explained the procedure to the patient.  Estimated blood loss: 50 milliliters.

## 2018-05-28 ENCOUNTER — Other Ambulatory Visit (HOSPITAL_COMMUNITY): Payer: Self-pay | Admitting: Obstetrics and Gynecology

## 2018-05-28 DIAGNOSIS — N883 Incompetence of cervix uteri: Secondary | ICD-10-CM

## 2018-05-30 ENCOUNTER — Encounter (HOSPITAL_COMMUNITY): Payer: Self-pay

## 2018-06-02 ENCOUNTER — Encounter (HOSPITAL_COMMUNITY): Payer: Self-pay

## 2018-06-05 ENCOUNTER — Encounter (HOSPITAL_COMMUNITY): Payer: Self-pay

## 2018-06-06 ENCOUNTER — Encounter (HOSPITAL_COMMUNITY): Payer: Self-pay

## 2018-06-06 ENCOUNTER — Ambulatory Visit (HOSPITAL_COMMUNITY)
Admission: RE | Admit: 2018-06-06 | Discharge: 2018-06-06 | Disposition: A | Discharge status: Home / Self Care | Payer: BLUE CROSS/BLUE SHIELD | Source: Ambulatory Visit | Attending: Obstetrics and Gynecology | Admitting: Obstetrics and Gynecology

## 2018-06-06 DIAGNOSIS — O3432 Maternal care for cervical incompetence, second trimester: Secondary | ICD-10-CM

## 2018-06-06 DIAGNOSIS — N883 Incompetence of cervix uteri: Secondary | ICD-10-CM

## 2018-06-06 DIAGNOSIS — O09812 Supervision of pregnancy resulting from assisted reproductive technology, second trimester: Secondary | ICD-10-CM | POA: Diagnosis not present

## 2018-06-06 DIAGNOSIS — O99212 Obesity complicating pregnancy, second trimester: Secondary | ICD-10-CM

## 2018-06-06 DIAGNOSIS — O09522 Supervision of elderly multigravida, second trimester: Secondary | ICD-10-CM | POA: Insufficient documentation

## 2018-06-06 DIAGNOSIS — Z3A20 20 weeks gestation of pregnancy: Secondary | ICD-10-CM | POA: Diagnosis not present

## 2018-06-06 DIAGNOSIS — Z363 Encounter for antenatal screening for malformations: Secondary | ICD-10-CM | POA: Insufficient documentation

## 2018-06-09 ENCOUNTER — Other Ambulatory Visit (HOSPITAL_COMMUNITY): Payer: Self-pay | Admitting: *Deleted

## 2018-06-09 DIAGNOSIS — Z362 Encounter for other antenatal screening follow-up: Secondary | ICD-10-CM

## 2018-07-04 ENCOUNTER — Ambulatory Visit (HOSPITAL_COMMUNITY)
Admission: RE | Admit: 2018-07-04 | Discharge: 2018-07-04 | Disposition: A | Discharge status: Home / Self Care | Payer: BLUE CROSS/BLUE SHIELD | Source: Ambulatory Visit | Attending: Obstetrics and Gynecology | Admitting: Obstetrics and Gynecology

## 2018-07-04 ENCOUNTER — Encounter (HOSPITAL_COMMUNITY): Payer: Self-pay

## 2018-07-04 DIAGNOSIS — Z3A24 24 weeks gestation of pregnancy: Secondary | ICD-10-CM | POA: Diagnosis not present

## 2018-07-04 DIAGNOSIS — Z362 Encounter for other antenatal screening follow-up: Secondary | ICD-10-CM | POA: Diagnosis present

## 2018-07-04 DIAGNOSIS — O3432 Maternal care for cervical incompetence, second trimester: Secondary | ICD-10-CM | POA: Diagnosis not present

## 2018-07-04 DIAGNOSIS — O09522 Supervision of elderly multigravida, second trimester: Secondary | ICD-10-CM | POA: Diagnosis not present

## 2018-07-04 DIAGNOSIS — O09812 Supervision of pregnancy resulting from assisted reproductive technology, second trimester: Secondary | ICD-10-CM | POA: Diagnosis not present

## 2018-07-04 DIAGNOSIS — O99212 Obesity complicating pregnancy, second trimester: Secondary | ICD-10-CM

## 2018-08-10 ENCOUNTER — Other Ambulatory Visit: Payer: Self-pay

## 2018-08-10 ENCOUNTER — Inpatient Hospital Stay (HOSPITAL_COMMUNITY)
Admission: AD | Admit: 2018-08-10 | Discharge: 2018-08-12 | DRG: 832 | Disposition: A | Discharge status: Home / Self Care | Payer: BLUE CROSS/BLUE SHIELD | Attending: Obstetrics and Gynecology | Admitting: Obstetrics and Gynecology

## 2018-08-10 ENCOUNTER — Encounter (HOSPITAL_COMMUNITY): Payer: Self-pay | Admitting: *Deleted

## 2018-08-10 DIAGNOSIS — Z3A29 29 weeks gestation of pregnancy: Secondary | ICD-10-CM | POA: Diagnosis not present

## 2018-08-10 DIAGNOSIS — O26873 Cervical shortening, third trimester: Secondary | ICD-10-CM | POA: Diagnosis present

## 2018-08-10 DIAGNOSIS — D259 Leiomyoma of uterus, unspecified: Secondary | ICD-10-CM | POA: Diagnosis present

## 2018-08-10 DIAGNOSIS — O3433 Maternal care for cervical incompetence, third trimester: Secondary | ICD-10-CM

## 2018-08-10 DIAGNOSIS — O133 Gestational [pregnancy-induced] hypertension without significant proteinuria, third trimester: Secondary | ICD-10-CM | POA: Diagnosis present

## 2018-08-10 DIAGNOSIS — O321XX0 Maternal care for breech presentation, not applicable or unspecified: Secondary | ICD-10-CM | POA: Diagnosis present

## 2018-08-10 DIAGNOSIS — O99213 Obesity complicating pregnancy, third trimester: Secondary | ICD-10-CM | POA: Diagnosis not present

## 2018-08-10 DIAGNOSIS — O3413 Maternal care for benign tumor of corpus uteri, third trimester: Secondary | ICD-10-CM | POA: Diagnosis present

## 2018-08-10 DIAGNOSIS — O09523 Supervision of elderly multigravida, third trimester: Secondary | ICD-10-CM | POA: Diagnosis not present

## 2018-08-10 DIAGNOSIS — O42919 Preterm premature rupture of membranes, unspecified as to length of time between rupture and onset of labor, unspecified trimester: Secondary | ICD-10-CM

## 2018-08-10 DIAGNOSIS — K5900 Constipation, unspecified: Secondary | ICD-10-CM | POA: Diagnosis present

## 2018-08-10 DIAGNOSIS — R102 Pelvic and perineal pain: Secondary | ICD-10-CM | POA: Diagnosis present

## 2018-08-10 DIAGNOSIS — O4703 False labor before 37 completed weeks of gestation, third trimester: Principal | ICD-10-CM | POA: Diagnosis present

## 2018-08-10 DIAGNOSIS — O4700 False labor before 37 completed weeks of gestation, unspecified trimester: Secondary | ICD-10-CM | POA: Diagnosis present

## 2018-08-10 DIAGNOSIS — O09813 Supervision of pregnancy resulting from assisted reproductive technology, third trimester: Secondary | ICD-10-CM | POA: Diagnosis not present

## 2018-08-10 HISTORY — DX: Gestational (pregnancy-induced) hypertension without significant proteinuria, complicating childbirth: O13.4

## 2018-08-10 HISTORY — DX: Herpesviral infection, unspecified: B00.9

## 2018-08-10 LAB — CBC WITH DIFFERENTIAL/PLATELET
BASOS PCT: 0 %
Basophils Absolute: 0 10*3/uL (ref 0.0–0.1)
EOS ABS: 0 10*3/uL (ref 0.0–0.5)
Eosinophils Relative: 0 %
HEMATOCRIT: 37.1 % (ref 36.0–46.0)
HEMOGLOBIN: 12.5 g/dL (ref 12.0–15.0)
Lymphocytes Relative: 16 %
Lymphs Abs: 1.3 10*3/uL (ref 0.7–4.0)
MCH: 31 pg (ref 26.0–34.0)
MCHC: 33.7 g/dL (ref 30.0–36.0)
MCV: 92.1 fL (ref 80.0–100.0)
MONOS PCT: 3 %
Monocytes Absolute: 0.3 10*3/uL (ref 0.1–1.0)
NEUTROS ABS: 6.7 10*3/uL (ref 1.7–7.7)
NRBC: 0 % (ref 0.0–0.2)
Neutrophils Relative %: 81 %
Platelets: 255 10*3/uL (ref 150–400)
RBC: 4.03 MIL/uL (ref 3.87–5.11)
RDW: 14.1 % (ref 11.5–15.5)
WBC: 8.3 10*3/uL (ref 4.0–10.5)

## 2018-08-10 LAB — COMPREHENSIVE METABOLIC PANEL
ALBUMIN: 3 g/dL — AB (ref 3.5–5.0)
ALK PHOS: 117 U/L (ref 38–126)
ALT: 28 U/L (ref 0–44)
ANION GAP: 9 (ref 5–15)
AST: 23 U/L (ref 15–41)
BILIRUBIN TOTAL: 0.5 mg/dL (ref 0.3–1.2)
BUN: 6 mg/dL (ref 6–20)
CALCIUM: 8.5 mg/dL — AB (ref 8.9–10.3)
CO2: 20 mmol/L — AB (ref 22–32)
Chloride: 105 mmol/L (ref 98–111)
Creatinine, Ser: 0.76 mg/dL (ref 0.44–1.00)
GFR calc Af Amer: >60 mL/min (ref >60)
GFR calc non Af Amer: >60 mL/min (ref >60)
GLUCOSE: 81 mg/dL (ref 70–99)
Potassium: 3.6 mmol/L (ref 3.5–5.1)
SODIUM: 134 mmol/L — AB (ref 135–145)
TOTAL PROTEIN: 7.6 g/dL (ref 6.5–8.1)

## 2018-08-10 LAB — URINALYSIS, ROUTINE W REFLEX MICROSCOPIC
Bacteria, UA: NONE SEEN
Bilirubin Urine: NEGATIVE
GLUCOSE, UA: NEGATIVE mg/dL
Hgb urine dipstick: NEGATIVE
KETONES UR: 80 mg/dL — AB
Nitrite: NEGATIVE
Protein, ur: 30 mg/dL — AB
Specific Gravity, Urine: 1.017 (ref 1.005–1.030)
pH: 6 (ref 5.0–8.0)

## 2018-08-10 LAB — WET PREP, GENITAL
CLUE CELLS WET PREP: NONE SEEN
Sperm: NONE SEEN
Trich, Wet Prep: NONE SEEN
Yeast Wet Prep HPF POC: NONE SEEN

## 2018-08-10 LAB — PROTEIN / CREATININE RATIO, URINE
Creatinine, Urine: 244 mg/dL
PROTEIN CREATININE RATIO: 0.13 mg/mg{creat} (ref 0.00–0.15)
TOTAL PROTEIN, URINE: 31 mg/dL

## 2018-08-10 LAB — TYPE AND SCREEN
ABO/RH(D): O POS
Antibody Screen: NEGATIVE

## 2018-08-10 LAB — FETAL FIBRONECTIN: FETAL FIBRONECTIN: POSITIVE — AB

## 2018-08-10 MED ORDER — PRENATAL MULTIVITAMIN CH
1.0000 | ORAL_TABLET | Freq: Every day | ORAL | Status: DC
  Filled 2018-08-10 (×3): qty 1

## 2018-08-10 MED ORDER — DOCUSATE SODIUM 100 MG PO CAPS
100.0000 mg | ORAL_CAPSULE | Freq: Every day | ORAL | Status: DC
  Administered 2018-08-10 – 2018-08-12 (×3): 100 mg via ORAL
  Filled 2018-08-10 (×5): qty 1

## 2018-08-10 MED ORDER — LACTATED RINGERS IV BOLUS
1000.0000 mL | Freq: Once | INTRAVENOUS | Status: AC
  Administered 2018-08-10: 1000 mL via INTRAVENOUS

## 2018-08-10 MED ORDER — NIFEDIPINE 10 MG PO CAPS
10.0000 mg | ORAL_CAPSULE | ORAL | Status: AC
  Administered 2018-08-10 (×3): 10 mg via ORAL
  Filled 2018-08-10 (×3): qty 1

## 2018-08-10 MED ORDER — ACETAMINOPHEN 325 MG PO TABS
650.0000 mg | ORAL_TABLET | ORAL | Status: DC | PRN
  Administered 2018-08-10 (×2): 650 mg via ORAL
  Filled 2018-08-10 (×2): qty 2

## 2018-08-10 MED ORDER — ZOLPIDEM TARTRATE 5 MG PO TABS: 5.0000 mg | ORAL_TABLET | Freq: Every evening | ORAL | Status: DC | PRN

## 2018-08-10 MED ORDER — BETAMETHASONE SOD PHOS & ACET 6 (3-3) MG/ML IJ SUSP
12.0000 mg | INTRAMUSCULAR | Status: AC
  Administered 2018-08-10 – 2018-08-11 (×2): 12 mg via INTRAMUSCULAR
  Filled 2018-08-10 (×2): qty 2

## 2018-08-10 MED ORDER — LACTATED RINGERS IV SOLN
INTRAVENOUS | Status: DC
  Administered 2018-08-10 – 2018-08-12 (×5): via INTRAVENOUS

## 2018-08-10 MED ORDER — CALCIUM CARBONATE ANTACID 500 MG PO CHEW
2.0000 | CHEWABLE_TABLET | ORAL | Status: DC | PRN
  Administered 2018-08-11: 400 mg via ORAL
  Filled 2018-08-10: qty 2

## 2018-08-10 NOTE — H&P (Signed)
43 y.o. [redacted]w[redacted]d  G1P0 comes in c/o back pain and pelvic pain.  She states she was seen Friday in our office and was doing fine.  She has been reporting constipation and was told to take Milk of Mag.  She reports feeling like it has had a laxative effect causing discomfort and small hard stools.  She is frequently voiding but denies UTI symptoms.  She also states baby is breech and it hurts when baby kicks bladder.  Otherwise has good fetal movement and no bleeding or leaking fluid.  While in [place] found to be contracting, FFN collected and returned positive.  She has a cerclage in place for short cervix at 18 weeks and has a history of myomectomy for which she is scheduled to undergo c/s at 37 weeks.  She states she thought the pain she was having was due to constipation and was surprised that contractions were seen.  She received 3 doses of 10mg  labetalol in MAu.  She states contractions have not worsened, Rn reports that they have spaced out.  Denies HA/vision change or RUq pain.  Past Medical History:  Diagnosis Date  . Endometriosis   . Medical history non-contributory     Past Surgical History:  Procedure Laterality Date  . CERVICAL CERCLAGE N/A 05/24/2018   Procedure: CERCLAGE CERVICAL;  Surgeon: Tama High, MD;  Location: [facility];  Service: Gynecology;  Laterality: N/A;  . HYSTEROSCOPY    . LAPAROSCOPY    . MYOMECTOMY  2019    OB History  Gravida Para Term Preterm AB Living  1         0  SAB TAB Ectopic Multiple Live Births               # Outcome Date GA Lbr Len/2nd Weight Sex Delivery Anes PTL Lv  1 Current             Social History   Socioeconomic History  . Marital status: Married    Spouse name: Not on file  . Number of children: Not on file  . Years of education: Not on file  . Highest education level: Not on file  Occupational History  . Not on file  Social Needs  . Financial resource strain: Not on file  . Food insecurity:    Worry: Not on file     Inability: Not on file  . Transportation needs:    Medical: Not on file    Non-medical: Not on file  Tobacco Use  . Smoking status: Never Smoker  . Smokeless tobacco: Never Used  Substance and Sexual Activity  . Alcohol use: Not Currently    Comment: Social  . Drug use: Never  . Sexual activity: Not Currently    Birth control/protection: None  Lifestyle  . Physical activity:    Days per week: Not on file    Minutes per session: Not on file  . Stress: Not on file  Relationships  . Social connections:    Talks on phone: Not on file    Gets together: Not on file    Attends religious service: Not on file    Active member of club or organization: Not on file    Attends meetings of clubs or organizations: Not on file    Relationship status: Not on file  . Intimate partner violence:    Fear of current or ex partner: Not on file    Emotionally abused: Not on file    Physically abused: Not  on file    Forced sexual activity: Not on file  Other Topics Concern  . Not on file  Social History Narrative  . Not on file   Adhesive [tape]    Prenatal Transfer Tool  Maternal Diabetes: No Genetic Screening: Normal Maternal Ultrasounds/Referrals: Normal Fetal Ultrasounds or other Referrals:  Referred to Materal Fetal Medicine for cerclage placement recuse at 18 weeks Maternal Substance Abuse:  No Significant Maternal Medications:  None Significant Maternal Lab Results: Lab values include: Other: none  Other PNC: AMA with additional risk factors- ASA 81mg  daily.  IVF pregnancy with short cervix at 18 weeks, rescue cerclage placed by MFM.  Genital HSV, no current symptoms.  H/o myomectomy x 2, known persistent fibroids.    Vitals:   08/10/18 1318 08/10/18 1347 08/10/18 1400 08/10/18 1634  BP: (!) 142/97 135/89 138/90 (!) 140/96  Pulse:   (!) 101 99  Resp:    16  Temp:      TempSrc:      Weight:    112.5 kg  Height:    5\' 5"  (1.651 m)    Lungs/Cor:  NAD Abdomen:  soft,  gravid Ex:  no cords, erythema SVE:  Cerclage intact without apparent straining of stitch  FHTs:  145, good STV, NST R; Cat 1 tracing. Toco:  irregular   A/P   Admit for observation to R/O PTL and eval new onset elevated BPs with new dx of GHTN Urine for culture, no CVA tenderness, increased frequency, leuks on dip Continuous EFM and TOCO s/p procardia 10mg  x 3 doses, pt with mild symptoms and told to alert [place] with bleeding, LOF or increased strength or frequency of contractions or increased pain.  FFN+ Will hold off on MgSO4 for neuroprotection unless contractions worsen S/p beta x 1, repeat tomorrow SCDs CBC, CMP, pr:cr tomorrow GBS not yet collected  Allyn Kenner

## 2018-08-10 NOTE — MAU Provider Note (Signed)
History     CSN: [id number]  Arrival date and time: 08/10/18 1139   First Provider Initiated Contact with Patient 08/10/18 1216      Chief Complaint  Patient presents with  . Back Pain  . Abdominal Pain   Meagan Davis is a 43 y.o. G1P0 at [redacted]w[redacted]d who presents today with lower back pain and abdominal pain since yesterday. She was constipated, and took milk of magnesia on Friday. She had one small bowel movement. However, yesterday she started to have frequent bowel movements, but they were all small pellets. She reports that she feels very dehydrated.   Back Pain  This is a new problem. The current episode started yesterday. The problem occurs intermittently. The problem is unchanged. The pain is present in the lumbar spine and sacro-iliac. The pain is at a severity of 6/10. Associated symptoms include pelvic pain. Pertinent negatives include no dysuria, fever or headaches. Abdominal pain: denies RUQ pain  She has tried nothing for the symptoms.  Pelvic Pain  The patient's primary symptoms include pelvic pain. The patient's pertinent negatives include no vaginal discharge. This is a new problem. The current episode started in the past 7 days. The problem occurs intermittently. The problem has been unchanged. Pain severity now: 6/10. The problem affects both sides. She is pregnant. Associated symptoms include back pain and constipation. Pertinent negatives include no chills, dysuria, fever, frequency, headaches, nausea or vomiting. Abdominal pain: denies RUQ pain  The vaginal discharge was clear and mucoid. There has been no bleeding. Sexual activity: Denies intercourse in the last 24 hours.      Past Medical History:  Diagnosis Date  . Endometriosis   . Medical history non-contributory     Past Surgical History:  Procedure Laterality Date  . CERVICAL CERCLAGE N/A 05/24/2018   Procedure: CERCLAGE CERVICAL;  Surgeon: Tama High, MD;  Location: [facility];  Service: Gynecology;   Laterality: N/A;  . HYSTEROSCOPY    . LAPAROSCOPY    . MYOMECTOMY  2019    Family History  Problem Relation Age of Onset  . Healthy Mother   . Healthy Father     Social History   Tobacco Use  . Smoking status: Never Smoker  . Smokeless tobacco: Never Used  Substance Use Topics  . Alcohol use: Not Currently    Comment: Social  . Drug use: Never    Allergies: No Known Allergies  Medications Prior to Admission  Medication Sig Dispense Refill Last Dose  . aspirin EC 81 MG tablet Take 81 mg by mouth daily.   Taking  . docusate sodium (COLACE) 100 MG capsule Take 100 mg by mouth daily.   Taking  . famotidine (PEPCID) 20 MG tablet Take 20 mg by mouth daily.   Taking  . oseltamivir (TAMIFLU) 30 MG capsule Take 30 mg by mouth.   Not Taking  . Prenatal Vit-Fe Fumarate-FA (PRENATAL MULTIVITAMIN) TABS tablet Take 1 tablet by mouth daily at 12 noon.   Taking    Review of Systems  Constitutional: Negative for chills and fever.  Eyes: Negative for visual disturbance.  Gastrointestinal: Positive for constipation. Negative for nausea and vomiting. Abdominal pain: denies RUQ pain   Genitourinary: Positive for pelvic pain. Negative for dysuria, frequency, vaginal bleeding and vaginal discharge.  Musculoskeletal: Positive for back pain.  Neurological: Negative for headaches.   Physical Exam   Blood pressure (!) 148/95, pulse 91, temperature 97.7 F (36.5 C), temperature source Oral, resp. rate 18, height 5\' 5"  (1.651  m), weight 112.5 kg, last menstrual period 01/19/2018.  Physical Exam  Nursing note and vitals reviewed. Constitutional: She is oriented to person, place, and time. She appears well-developed and well-nourished. No distress.  HENT:  Head: Normocephalic.  Cardiovascular: Normal rate.  Respiratory: Effort normal.  GI: Soft. There is no abdominal tenderness. There is no rebound.  Genitourinary:    Genitourinary Comments:  External: no lesion Vagina: small amount of  white discharge Cervix: pink, smooth, cerclage appears intact. Cervix closed on palpation and no significant tension noted on cerclage.  Uterus: AGA    Neurological: She is alert and oriented to person, place, and time.  Skin: Skin is warm and dry.  Psychiatric: She has a normal mood and affect.    NST:  Baseline: 150 Variability: moderate Accels: 10x10 Decels: none Toco: q2 mins   Results for orders placed or performed during the hospital encounter of 08/10/18 (from the past 24 hour(s))  Urinalysis, Routine w reflex microscopic     Status: Abnormal   Collection Time: 08/10/18 11:55 AM  Result Value Ref Range   Color, Urine YELLOW YELLOW   APPearance HAZY (A) CLEAR   Specific Gravity, Urine 1.017 1.005 - 1.030   pH 6.0 5.0 - 8.0   Glucose, UA NEGATIVE NEGATIVE mg/dL   Hgb urine dipstick NEGATIVE NEGATIVE   Bilirubin Urine NEGATIVE NEGATIVE   Ketones, ur 80 (A) NEGATIVE mg/dL   Protein, ur 30 (A) NEGATIVE mg/dL   Nitrite NEGATIVE NEGATIVE   Leukocytes, UA SMALL (A) NEGATIVE   RBC / HPF 0-5 0 - 5 RBC/hpf   WBC, UA 11-20 0 - 5 WBC/hpf   Bacteria, UA NONE SEEN NONE SEEN   Squamous Epithelial / LPF 0-5 0 - 5   Mucus PRESENT   CBC with Differential/Platelet     Status: None   Collection Time: 08/10/18 12:11 PM  Result Value Ref Range   WBC 8.3 4.0 - 10.5 K/uL   RBC 4.03 3.87 - 5.11 MIL/uL   Hemoglobin 12.5 12.0 - 15.0 g/dL   HCT 37.1 36.0 - 46.0 %   MCV 92.1 80.0 - 100.0 fL   MCH 31.0 26.0 - 34.0 pg   MCHC 33.7 30.0 - 36.0 g/dL   RDW 14.1 11.5 - 15.5 %   Platelets 255 150 - 400 K/uL   nRBC 0.0 0.0 - 0.2 %   Neutrophils Relative % 81 %   Neutro Abs 6.7 1.7 - 7.7 K/uL   Lymphocytes Relative 16 %   Lymphs Abs 1.3 0.7 - 4.0 K/uL   Monocytes Relative 3 %   Monocytes Absolute 0.3 0.1 - 1.0 K/uL   Eosinophils Relative 0 %   Eosinophils Absolute 0.0 0.0 - 0.5 K/uL   Basophils Relative 0 %   Basophils Absolute 0.0 0.0 - 0.1 K/uL    Pt informed that the ultrasound is  considered a limited OB ultrasound and is not intended to be a complete ultrasound exam.  Patient also informed that the ultrasound is not being completed with the intent of assessing for fetal or placental anomalies or any pelvic abnormalities.  Explained that the purpose of today's ultrasound is to assess for  presentation.  Patient acknowledges the purpose of the exam and the limitations of the study.    BREECH   MAU Course  Procedures  MDM Patient still contracting after 3 doses of procardia and 1L of fluids. Patient reports that she is still feeling the contractions.   2:45 PM consult with Dr. Ilda Basset,  and he feels that patient should be obs and consider mag for tocolysis and neuroprotection.  Assessment and Plan   1. Threatened premature labor in third trimester   2. [redacted] weeks gestation of pregnancy   3. Cervical cerclage suture present in third trimester   4. Hypertension in pregnancy, transient, antepartum, third trimester    Admit to antenatal Routine orders BMZ Continuous monitoring   Marcille Buffy DNP, CNM  08/10/18  12:24 PM

## 2018-08-10 NOTE — MAU Note (Signed)
Galia Rahm is a 43 y.o. at [redacted]w[redacted]d here in MAU reporting: back pain since Saturday and abdominal pain since today. Both are intermittent and sharp. No LOF, no bleeding   Onset of complaint: saturday Pain score: 5/10 Vitals:   08/10/18 1146  BP: (!) 148/95  Pulse: 91  Resp: 18  Temp: 97.7 F (36.5 C)     FHT:+ FM Lab orders placed from triage: UA

## 2018-08-11 ENCOUNTER — Encounter (HOSPITAL_COMMUNITY): Payer: Self-pay | Admitting: *Deleted

## 2018-08-11 LAB — CULTURE, OB URINE

## 2018-08-11 LAB — GC/CHLAMYDIA PROBE AMP (~~LOC~~) NOT AT ARMC
Chlamydia: NEGATIVE
Neisseria Gonorrhea: NEGATIVE

## 2018-08-11 LAB — PROTEIN / CREATININE RATIO, URINE
Creatinine, Urine: 81 mg/dL
Protein Creatinine Ratio: 0.12 mg/mg{Cre} (ref 0.00–0.15)
Total Protein, Urine: 10 mg/dL

## 2018-08-11 MED ORDER — GLYCERIN (LAXATIVE) 2.1 G RE SUPP
1.0000 | Freq: Every day | RECTAL | Status: DC | PRN
  Filled 2018-08-11: qty 1

## 2018-08-11 MED ORDER — POLYETHYLENE GLYCOL 3350 17 G PO PACK
17.0000 g | PACK | Freq: Three times a day (TID) | ORAL | Status: DC
  Administered 2018-08-11 – 2018-08-12 (×2): 17 g via ORAL
  Filled 2018-08-11 (×3): qty 1

## 2018-08-11 MED ORDER — FLEET ENEMA 7-19 GM/118ML RE ENEM: 1.0000 | ENEMA | Freq: Every day | RECTAL | Status: DC | PRN

## 2018-08-11 NOTE — Progress Notes (Signed)
Patient ID: Meagan Davis, female   DOB: 1975-09-26, 43 y.o.   MRN: [id number]  S: Continuing to feel crampy abdominal pain. Uncertain if its related to needing to have BM or uterine activity. Active FM, no VB. Denies Headaches O:  Vitals:   08/11/18 0857 08/11/18 1159  BP: (!) 130/99 120/87  Pulse: 80 86  Resp: 16 18  Temp: 98.6 F (37 C) 98.7 F (37.1 C)  SpO2: 99% 98%   AOX3, NAD  FHR 140 reactive, cat 1 tracing cvx deferred Toco: uterine irritability   A/P 1) Threatened PTL: receiving BMZ. Second dose today 2) Severe constipation. Pt takes miralax TID at home and colace. Only receiving daily here. Will increase to TID, add dulcolax, glycerin suppository. 3) Pt now with elevated BPs and has met criteria for gestational hypertension

## 2018-08-12 ENCOUNTER — Inpatient Hospital Stay (HOSPITAL_BASED_OUTPATIENT_CLINIC_OR_DEPARTMENT_OTHER): Payer: BLUE CROSS/BLUE SHIELD

## 2018-08-12 ENCOUNTER — Inpatient Hospital Stay (HOSPITAL_COMMUNITY): Payer: BLUE CROSS/BLUE SHIELD

## 2018-08-12 DIAGNOSIS — O09813 Supervision of pregnancy resulting from assisted reproductive technology, third trimester: Secondary | ICD-10-CM | POA: Diagnosis not present

## 2018-08-12 DIAGNOSIS — O3433 Maternal care for cervical incompetence, third trimester: Secondary | ICD-10-CM

## 2018-08-12 DIAGNOSIS — Z3A29 29 weeks gestation of pregnancy: Secondary | ICD-10-CM

## 2018-08-12 DIAGNOSIS — O99213 Obesity complicating pregnancy, third trimester: Secondary | ICD-10-CM

## 2018-08-12 DIAGNOSIS — O09523 Supervision of elderly multigravida, third trimester: Secondary | ICD-10-CM

## 2018-08-12 MED ORDER — ASPIRIN EC 81 MG PO TBEC
81.0000 mg | DELAYED_RELEASE_TABLET | Freq: Every day | ORAL | Status: DC
  Administered 2018-08-12: 81 mg via ORAL
  Filled 2018-08-12 (×2): qty 1

## 2018-08-12 NOTE — Progress Notes (Signed)
Discharge teaching complete with pt. Pt understood all information and did not have any questions. Pt discharged home to family.

## 2018-08-12 NOTE — Progress Notes (Signed)
43 y.o. G1P0 44w5dHD#2 admitted for 29.5wks, lower back pain, right groin pain.  Possible PTL, cerclage in place and large fibroid uterus.    Pt currently stable with but did feel some contractions this am.  .  Good FM.  Vitals:   08/11/18 1655 08/11/18 2001 08/11/18 2338 08/12/18 0621  BP: (!) 134/98 (!) 135/94 124/87 126/79  Pulse: 81 72 86 76  Resp: _0 Temp: 98.5 F (36.9 C) 98 F (36.7 C) 97.8 F (36.6 C) 97.9 F (36.6 C)  TempSrc: Oral Oral Oral Oral  SpO2: 99% 99% 99% 98%  Weight:      Height:        Lungs CTA Cor RRR Abd  Soft, gravid, nontender Ex SCDs FHTs  120s, good short term variability, NST R last night. Toco  occ this am  Results for orders placed or performed during the hospital encounter of 08/10/18 (from the past 72 hour(s))  GC[facility]Chlamydia probe amp ([place])[facility]    Status: None   Collection Time: 08/10/18 12:00 AM  Result Value Ref Range   Chlamydia Negative     Comment: Normal Reference Range - Negative   Neisseria gonorrhea Negative     Comment: Normal Reference Range - Negative  Urinalysis, Routine w reflex microscopic     Status: Abnormal   Collection Time: 08/10/18 11:55 AM  Result Value Ref Range   Color, Urine YELLOW YELLOW   APPearance HAZY (A) CLEAR   Specific Gravity, Urine 1.017 1.005 - 1.030   pH 6.0 5.0 - 8.0   Glucose, UA NEGATIVE NEGATIVE mg/dL   Hgb urine dipstick NEGATIVE NEGATIVE   Bilirubin Urine NEGATIVE NEGATIVE   Ketones, ur 80 (A) NEGATIVE mg/dL   Protein, ur 30 (A) NEGATIVE mg/dL   Nitrite NEGATIVE NEGATIVE   Leukocytes, UA SMALL (A) NEGATIVE   RBC / HPF 0-5 0 - 5 RBC/hpf   WBC, UA 11-20 0 - 5 WBC/hpf   Bacteria, UA NONE SEEN NONE SEEN   Squamous Epithelial / LPF 0-5 0 - 5   Mucus PRESENT     Comment: Performed at [facility] [address] [place] [address] Protein / creatinine ratio, urine     Status: None   Collection Time: 08/10/18 11:58 AM  Result Value Ref Range   Creatinine, Urine 244.00 mg/dL   Total Protein, Urine 31 mg/dL    Comment: NO NORMAL RANGE ESTABLISHED FOR THIS TEST   Protein Creatinine Ratio 0.13 0.00 - 0.15 mg/mg[Cre]    Comment: [facility]formed at [facility] [address], [place] [id number] CBC with Differential/Platelet     Status: None   Collection Time: 08/10/18 12:11 PM  Result Value Ref Range   WBC 8.3 4.0 - 10.5 K/uL   RBC 4.03 3.87 - 5.11 MIL/uL   Hemoglobin 12.5 12.0 - 15.0 g/dL   HCT 37.1 36.0 - 46.0 %   MCV 92.1 80.0 - 100.0 fL   MCH 31.0 26.0 - 34.0 pg   MCHC 33.7 30.0 - 36.0 g/dL   RDW 14.1 11.5 - 15.5 %   Platelets 255 150 - 400 K/uL   nRBC 0.0 0.0 - 0.2 %   Neutrophils Relative % 81 %   Neutro Abs 6.7 1.7 - 7.7 K/uL   Lymphocytes Relative 16 %   Lymphs Abs 1.3 0.7 - 4.0 K/uL   Monocytes Relative 3 %   Monocytes Absolute 0.3 0.1 - 1.0 K/uL  Eosinophils Relative 0 %   Eosinophils Absolute 0.0 0.0 - 0.5 K/uL   Basophils Relative 0 %   Basophils Absolute 0.0 0.0 - 0.1 K/uL    Comment: Performed at [facility], [address], [place], [place] [address]  Comprehensive metabolic panel     Status: Abnormal   Collection Time: 08/10/18 12:11 PM  Result Value Ref Range   Sodium 134 (L) 135 - 145 mmol/L   Potassium 3.6 3.5 - 5.1 mmol/L   Chloride 105 98 - 111 mmol/L   CO2 20 (L) 22 - 32 mmol/L   Glucose, Bld 81 70 - 99 mg/dL   BUN 6 6 - 20 mg/dL   Creatinine, Ser 0.76 0.44 - 1.00 mg/dL   Calcium 8.5 (L) 8.9 - 10.3 mg/dL   Total Protein 7.6 6.5 - 8.1 g/dL   Albumin 3.0 (L) 3.5 - 5.0 g/dL   AST 23 15 - 41 U/L   ALT 28 0 - 44 U/L   Alkaline Phosphatase 117 38 - 126 U/L   Total Bilirubin 0.5 0.3 - 1.2 mg/dL   GFR calc non Af Amer >60 >60 mL/min   GFR calc Af Amer >60 >60 mL/min   Anion gap 9 5 - 15    Comment: Performed at [facility], [place], [place] [address]  Wet prep, genital     Status: Abnormal   Collection Time: 08/10/18 12:28 PM  Result Value Ref Range   Yeast  Wet Prep HPF POC NONE SEEN NONE SEEN   Trich, Wet Prep NONE SEEN NONE SEEN   Clue Cells Wet Prep HPF POC NONE SEEN NONE SEEN   WBC, Wet Prep HPF POC MANY (A) NONE SEEN    Comment: MODERATE BACTERIA SEEN   Sperm NONE SEEN     Comment: Performed at [facility], [address] [place], [place] [address]  Fetal fibronectin     Status: Abnormal   Collection Time: 08/10/18  2:51 PM  Result Value Ref Range   Fetal Fibronectin POSITIVE (A) NEGATIVE    Comment: Performed at [facility], [address] [place], [place] [address]  Type and screen [place]     Status: None   Collection Time: 08/10/18  3:33 PM  Result Value Ref Range   ABO/RH(D) O POS    Antibody Screen NEG    Sample Expiration      08/13/2018 Performed at [facility], [address], [place], [place] [address]   Culture, [place] Urine     Status: Abnormal   Collection Time: 08/10/18  5:15 PM  Result Value Ref Range   Specimen Description      URINE, CLEAN CATCH Performed at [facility], [address] [place], [place] [address]    Special Requests      NONE Performed at [facility], [address] [place], [place] [address]    Culture (A)     <10,000 COLONIES/mL INSIGNIFICANT GROWTH NO GROUP B STREP (S.AGALACTIAE) ISOLATED Performed at [facility], [place] [address] [place], [place] [address]    Report Status 08/11/2018 FINAL   Protein / creatinine ratio, urine     Status: None   Collection Time: 08/11/18  5:00 AM  Result Value Ref Range   Creatinine, Urine 81.00 mg/dL   Total Protein, Urine 10 mg/dL    Comment: NO NORMAL RANGE ESTABLISHED FOR THIS TEST   Protein Creatinine Ratio 0.12 0.00 - 0.15 mg/mg[Cre]    Comment: Performed at  [facility], [address] [place], [place] [address]    A:  HD#2  45w5dwith PTL possible and now GHTN..  P:  1) Threatened PTL: receiving BMZ. Second dose done.  FFN was positive but cerclage still intact. Contractions this  am - may go away if pt able to void. 2) Severe constipation. Pt takes miralax TID at home and colace. Increased to TID, add dulcolax, glycerin suppository. 3) Pt now with elevated BPs and has met criteria for gestational hypertension.  Labs were normal.  BPs are not severe range.  Pt to be delivered at 36-37 weeks anyway for hx of myomectomy.  ANT will be initiated at 32 weeks.   4) UKorealast at office was S=D, cervix was 1.72 cm.  Will get MFM to weigh in again.   MDaria Pastures

## 2018-08-12 NOTE — Progress Notes (Signed)
Pt comfortable now.   Vitals:   08/11/18 2338 08/12/18 0621 08/12/18 0758 08/12/18 1203  BP: 124/87 126/79 127/89 (!) 127/92  Pulse: 86 76 87 90  Resp: 18 18 20 20   Temp: 97.8 F (36.6 C) 97.9 F (36.6 C) 97.9 F (36.6 C) 98 F (36.7 C)  TempSrc: Oral Oral Oral Oral  SpO2: 99% 98% 98% 100%  Weight:      Height:        FHTs. 140s, gSTV, NST R. Toco now occ. SVE differed.  I spoke with Dr. Donalee Citrin who agrees at this point pt is stable and can go home.  Transvag US revealed 0.7 cm cervix with intact stitch and membranes above stitch.  Pt is s/p BMZ.  Has had another GHTN range BP today and will continue to be seen weekly. NST will begin at 32 weeks.  Biweekly testing at 34 weeks if still mild GHTN.  Pt scheduled for C/S at 37 weeks already for myomectomy and will deliver sooner only if labor or severe preeclampsia after 34 weeks.

## 2018-08-14 NOTE — Discharge Summary (Signed)
Physician Discharge Summary  Patient ID: Meagan Davis MRN: [id number] DOB/AGE: [birth year] 43 y.o.  Admit date: 08/10/2018 Discharge date: 08/14/2018  Admission Diagnoses:preterm contractions  Discharge Diagnoses: gestational hypertension and cerclage Active Problems:   Threatened premature labor   Discharged Condition: good  Hospital Course: Pt admitted for preterm labor and BMZ.  She did not change her cervix and the cerclage stayed intact.  Pt also had constipation that was relieved by miralax.  She had 3 BPs in 130-140/90 range and qualifies for gestational hypertension.  MFM consult agreed with management and d/c.  Consults: maternal fetal medicine  Significant Diagnostic Studies: labs:  Recent Results (from the past 2160 hour(s))  ABO/Rh     Status: None   Collection Time: 05/23/18  5:07 PM  Result Value Ref Range   ABO/RH(D)      O POS Performed at [facility], [address], [place], [place] [address]   CBC with Differential/Platelet     Status: Abnormal   Collection Time: 05/23/18  5:07 PM  Result Value Ref Range   WBC 5.6 4.0 - 10.5 K/uL   RBC 3.58 (L) 3.87 - 5.11 MIL/uL   Hemoglobin 11.0 (L) 12.0 - 15.0 g/dL   HCT 33.0 (L) 36.0 - 46.0 %   MCV 92.2 80.0 - 100.0 fL   MCH 30.7 26.0 - 34.0 pg   MCHC 33.3 30.0 - 36.0 g/dL   RDW 15.7 (H) 11.5 - 15.5 %   Platelets 193 150 - 400 K/uL   nRBC 0.0 0.0 - 0.2 %   Neutrophils Relative % 68 %   Neutro Abs 3.8 1.7 - 7.7 K/uL   Lymphocytes Relative 25 %   Lymphs Abs 1.4 0.7 - 4.0 K/uL   Monocytes Relative 6 %   Monocytes Absolute 0.3 0.1 - 1.0 K/uL   Eosinophils Relative 1 %   Eosinophils Absolute 0.1 0.0 - 0.5 K/uL   Basophils Relative 0 %   Basophils Absolute 0.0 0.0 - 0.1 K/uL    Comment: Performed at [facility], [address] [place], [place] [address]  Type and screen [place]     Status: None   Collection Time: 05/23/18  5:07 PM  Result Value Ref Range   ABO/RH(D) O POS    Antibody Screen NEG    Sample Expiration      05/26/2018 Performed at [facility], [address] [place], [place] [address]   CBC     Status: Abnormal   Collection Time: 05/24/18  5:35 AM  Result Value Ref Range   WBC 5.1 4.0 - 10.5 K/uL   RBC 3.77 (L) 3.87 - 5.11 MIL/uL   Hemoglobin 11.6 (L) 12.0 - 15.0 g/dL   HCT 34.6 (L) 36.0 - 46.0 %   MCV 91.8 80.0 - 100.0 fL   MCH 30.8 26.0 - 34.0 pg   MCHC 33.5 30.0 - 36.0 g/dL   RDW 15.9 (H) 11.5 - 15.5 %   Platelets 209 150 - 400 K/uL   nRBC 0.0 0.0 - 0.2 %    Comment: Performed at [facility], [address], [place], [place] [address]  GC/Chlamydia probe amp ([place])not at [facility]     Status: None   Collection Time: 08/10/18 12:00 AM  Result Value Ref Range   Chlamydia Negative     Comment: Normal Reference Range - Negative   Neisseria gonorrhea Negative     Comment: Normal Reference Range - Negative  Urinalysis, Routine w reflex microscopic  Status: Abnormal   Collection Time: 08/10/18 11:55 AM  Result Value Ref Range   Color, Urine YELLOW YELLOW   APPearance HAZY (A) CLEAR   Specific Gravity, Urine 1.017 1.005 - 1.030   pH 6.0 5.0 - 8.0   Glucose, UA NEGATIVE NEGATIVE mg/dL   Hgb urine dipstick NEGATIVE NEGATIVE   Bilirubin Urine NEGATIVE NEGATIVE   Ketones, ur 80 (A) NEGATIVE mg/dL   Protein, ur 30 (A) NEGATIVE mg/dL   Nitrite NEGATIVE NEGATIVE   Leukocytes, UA SMALL (A) NEGATIVE   RBC / HPF 0-5 0 - 5 RBC/hpf   WBC, UA 11-20 0 - 5 WBC/hpf   Bacteria, UA NONE SEEN NONE SEEN   Squamous Epithelial / LPF 0-5 0 - 5   Mucus PRESENT     Comment: Performed at [facility], [address], [place], [place] [address]  Protein / creatinine ratio, urine     Status: None   Collection Time: 08/10/18 11:58 AM  Result Value Ref Range   Creatinine, Urine 244.00 mg/dL   Total Protein, Urine 31 mg/dL    Comment: NO NORMAL RANGE ESTABLISHED FOR THIS TEST   Protein Creatinine Ratio 0.13 0.00 - 0.15 mg/mg[Cre]     Comment: Performed at [facility], [address], [place], [place] [address]  CBC with Differential/Platelet     Status: None   Collection Time: 08/10/18 12:11 PM  Result Value Ref Range   WBC 8.3 4.0 - 10.5 K/uL   RBC 4.03 3.87 - 5.11 MIL/uL   Hemoglobin 12.5 12.0 - 15.0 g/dL   HCT 37.1 36.0 - 46.0 %   MCV 92.1 80.0 - 100.0 fL   MCH 31.0 26.0 - 34.0 pg   MCHC 33.7 30.0 - 36.0 g/dL   RDW 14.1 11.5 - 15.5 %   Platelets 255 150 - 400 K/uL   nRBC 0.0 0.0 - 0.2 %   Neutrophils Relative % 81 %   Neutro Abs 6.7 1.7 - 7.7 K/uL   Lymphocytes Relative 16 %   Lymphs Abs 1.3 0.7 - 4.0 K/uL   Monocytes Relative 3 %   Monocytes Absolute 0.3 0.1 - 1.0 K/uL   Eosinophils Relative 0 %   Eosinophils Absolute 0.0 0.0 - 0.5 K/uL   Basophils Relative 0 %   Basophils Absolute 0.0 0.0 - 0.1 K/uL    Comment: Performed at [facility], [address], [place], [place] [address]  Comprehensive metabolic panel     Status: Abnormal   Collection Time: 08/10/18 12:11 PM  Result Value Ref Range   Sodium 134 (L) 135 - 145 mmol/L   Potassium 3.6 3.5 - 5.1 mmol/L   Chloride 105 98 - 111 mmol/L   CO2 20 (L) 22 - 32 mmol/L   Glucose, Bld 81 70 - 99 mg/dL   BUN 6 6 - 20 mg/dL   Creatinine, Ser 0.76 0.44 - 1.00 mg/dL   Calcium 8.5 (L) 8.9 - 10.3 mg/dL   Total Protein 7.6 6.5 - 8.1 g/dL   Albumin 3.0 (L) 3.5 - 5.0 g/dL   AST 23 15 - 41 U/L   ALT 28 0 - 44 U/L   Alkaline Phosphatase 117 38 - 126 U/L   Total Bilirubin 0.5 0.3 - 1.2 mg/dL   GFR calc non Af Amer >60 >60 mL/min   GFR calc Af Amer >60 >60 mL/min   Anion gap 9 5 - 15    Comment: Performed at [facility], [address] [place], [place] [address]  Wet prep, genital     Status: Abnormal   Collection Time: 08/10/18 12:28 PM  Result Value Ref Range   Yeast Wet Prep HPF POC NONE SEEN NONE SEEN   Trich, Wet Prep NONE SEEN NONE SEEN   Clue Cells Wet Prep HPF POC NONE SEEN NONE SEEN   WBC, Wet Prep HPF POC MANY (A) NONE SEEN     Comment: MODERATE BACTERIA SEEN   Sperm NONE SEEN     Comment: Performed at [facility], [address], [place], [place] [address]  Fetal fibronectin     Status: Abnormal   Collection Time: 08/10/18  2:51 PM  Result Value Ref Range   Fetal Fibronectin POSITIVE (A) NEGATIVE    Comment: Performed at [facility], [address], [place], [place] [address]  Type and screen [place]     Status: None   Collection Time: 08/10/18  3:33 PM  Result Value Ref Range   ABO/RH(D) O POS    Antibody Screen NEG    Sample Expiration      08/13/2018 Performed at [facility], [address] [place], [place] [address]   Culture, [place] Urine     Status: Abnormal   Collection Time: 08/10/18  5:15 PM  Result Value Ref Range   Specimen Description      URINE, CLEAN CATCH Performed at [facility], [address] [place], [place] [address]    Special Requests      NONE Performed at [facility], [address] [place], [place] [address]    Culture (A)     <10,000 COLONIES/mL INSIGNIFICANT GROWTH NO GROUP B STREP (S.AGALACTIAE) ISOLATED Performed at [place] [address], [place], [place] [address]    Report Status 08/11/2018 FINAL   Protein / creatinine ratio, urine     Status: None   Collection Time: 08/11/18  5:00 AM  Result Value Ref Range   Creatinine, Urine 81.00 mg/dL   Total Protein, Urine 10 mg/dL    Comment: NO NORMAL RANGE ESTABLISHED FOR THIS TEST   Protein Creatinine Ratio 0.12 0.00 - 0.15 mg/mg[Cre]    Comment: Performed at [facility], [address] [place], [place] [address]    Treatments: IV hydration and BMZ  Discharge Exam: Blood pressure (!) 127/92, pulse 90, temperature 98 F (36.7 C), temperature source Oral, resp. rate 20, height 5\' 5"  (1.651 m), weight 112.5 kg, last menstrual period 01/19/2018, SpO2 100 %.   Disposition:   Discharge Instructions    Discharge activity:  Up to eat   Complete by:  As  directed    Discharge diet:  No restrictions   Complete by:  As directed    Discharge instructions   Complete by:  As directed    Modified bedrest.  Refrain from intercourse.  Count baby's movements in 1 hour per day- if you don't get 6 in that hour, call.   Do not have sex or do anything that might make you have an orgasm   Complete by:  As directed    Fetal Kick Count:  Lie on our left side for one hour after a meal, and count the number of times your baby kicks.  If it is less than 5 times, get up, move around and drink some juice.  Repeat the test 30 minutes later.  If it is still less than 5 kicks in an hour, notify your doctor.   Complete by:  As directed    LABOR:  When conractions begin, you should start to time them from the beginning of one contraction to the beginning  of the next.  When contractions are 5 - 10 minutes apart or less and have been regular for at least an hour, you should call your health care provider.   Complete by:  As directed    Notify physician for a general feeling that "something is not right"   Complete by:  As directed    Notify physician for bleeding from the vagina   Complete by:  As directed    Notify physician for blurring of vision or spots before the eyes   Complete by:  As directed    Notify physician for chills or fever   Complete by:  As directed    Notify physician for fainting spells, "black outs" or loss of consciousness   Complete by:  As directed    Notify physician for increase in vaginal discharge   Complete by:  As directed    Notify physician for increase or change in vaginal discharge   Complete by:  As directed    Notify physician for intestinal cramps, with or without diarrhea, sometimes described as "gas pain"   Complete by:  As directed    Notify physician for leaking of fluid   Complete by:  As directed    Notify physician for leaking of fluid   Complete by:  As directed    Notify physician for low, dull backache, unrelieved  by heat or Tylenol   Complete by:  As directed    Notify physician for menstrual like cramps   Complete by:  As directed    Notify physician for pain or burning when urinating   Complete by:  As directed    Notify physician for pelvic pressure   Complete by:  As directed    Notify physician for pelvic pressure (sudden increase)   Complete by:  As directed    Notify physician for severe or continued nausea or vomiting   Complete by:  As directed    Notify physician for sudden gushing of fluid from the vagina (with or without continued leaking)   Complete by:  As directed    Notify physician for sudden, constant, or occasional abdominal pain   Complete by:  As directed    Notify physician for uterine contractions.  These may be painless and feel like the uterus is tightening or the baby is  "balling up"   Complete by:  As directed    Notify physician for vaginal bleeding   Complete by:  As directed    Notify physician if baby moving less than usual   Complete by:  As directed    PRETERM LABOR:  Includes any of the follwing symptoms that occur between 20 - [redacted] weeks gestation.  If these symptoms are not stopped, preterm labor can result in preterm delivery, placing your baby at risk   Complete by:  As directed      Allergies as of 08/12/2018      Reactions   Adhesive [tape] Rash   rash   Oxycodone-acetaminophen Nausea And Vomiting      Medication List    TAKE these medications   acetaminophen 500 MG tablet Commonly known as:  TYLENOL Take 500 mg by mouth every 6 (six) hours as needed for moderate pain.   aspirin EC 81 MG tablet Take 81 mg by mouth daily.   docusate sodium 100 MG capsule Commonly known as:  COLACE  Take 100 mg by mouth daily.   famotidine 20 MG tablet Commonly known as:  PEPCID Take 20 mg by mouth daily as needed for heartburn.   glycerin adult 2 g suppository Place 1 suppository rectally as needed for constipation.   magnesium hydroxide 400 MG/5ML  suspension Commonly known as:  MILK OF MAGNESIA Take 15 mLs by mouth daily as needed for mild constipation.   prenatal multivitamin Tabs tablet Take 1 tablet by mouth daily at 12 noon.      Follow-up Information    Bobbye Charleston, MD Follow up in 1 week(s).   Specialty:  Obstetrics and Gynecology Contact information: [address] [place] [address] [phone]           Signed: Daria Pastures 08/14/2018, 7:22 AM

## 2018-08-24 ENCOUNTER — Encounter (HOSPITAL_COMMUNITY): Payer: Self-pay | Admitting: *Deleted

## 2018-08-24 ENCOUNTER — Inpatient Hospital Stay (HOSPITAL_COMMUNITY)
Admission: AD | Admit: 2018-08-24 | Discharge: 2018-08-27 | DRG: 832 | Disposition: A | Discharge status: Other Acute Inpatient Hospital | Payer: BLUE CROSS/BLUE SHIELD | Attending: Obstetrics | Admitting: Obstetrics

## 2018-08-24 DIAGNOSIS — O09523 Supervision of elderly multigravida, third trimester: Secondary | ICD-10-CM | POA: Diagnosis not present

## 2018-08-24 DIAGNOSIS — O98313 Other infections with a predominantly sexual mode of transmission complicating pregnancy, third trimester: Secondary | ICD-10-CM | POA: Diagnosis present

## 2018-08-24 DIAGNOSIS — O3433 Maternal care for cervical incompetence, third trimester: Secondary | ICD-10-CM | POA: Diagnosis present

## 2018-08-24 DIAGNOSIS — O42919 Preterm premature rupture of membranes, unspecified as to length of time between rupture and onset of labor, unspecified trimester: Secondary | ICD-10-CM

## 2018-08-24 DIAGNOSIS — K5909 Other constipation: Secondary | ICD-10-CM | POA: Diagnosis present

## 2018-08-24 DIAGNOSIS — D259 Leiomyoma of uterus, unspecified: Secondary | ICD-10-CM | POA: Diagnosis present

## 2018-08-24 DIAGNOSIS — O42913 Preterm premature rupture of membranes, unspecified as to length of time between rupture and onset of labor, third trimester: Secondary | ICD-10-CM | POA: Diagnosis present

## 2018-08-24 DIAGNOSIS — O26893 Other specified pregnancy related conditions, third trimester: Secondary | ICD-10-CM | POA: Diagnosis present

## 2018-08-24 DIAGNOSIS — O99213 Obesity complicating pregnancy, third trimester: Secondary | ICD-10-CM | POA: Diagnosis not present

## 2018-08-24 DIAGNOSIS — O3413 Maternal care for benign tumor of corpus uteri, third trimester: Secondary | ICD-10-CM | POA: Diagnosis present

## 2018-08-24 DIAGNOSIS — O133 Gestational [pregnancy-induced] hypertension without significant proteinuria, third trimester: Secondary | ICD-10-CM | POA: Diagnosis present

## 2018-08-24 DIAGNOSIS — O09813 Supervision of pregnancy resulting from assisted reproductive technology, third trimester: Secondary | ICD-10-CM | POA: Diagnosis not present

## 2018-08-24 DIAGNOSIS — Z3A31 31 weeks gestation of pregnancy: Secondary | ICD-10-CM | POA: Diagnosis not present

## 2018-08-24 DIAGNOSIS — N809 Endometriosis, unspecified: Secondary | ICD-10-CM | POA: Diagnosis present

## 2018-08-24 DIAGNOSIS — A6000 Herpesviral infection of urogenital system, unspecified: Secondary | ICD-10-CM | POA: Diagnosis present

## 2018-08-24 LAB — CBC
HCT: 35.3 % — ABNORMAL LOW (ref 36.0–46.0)
Hemoglobin: 11.8 g/dL — ABNORMAL LOW (ref 12.0–15.0)
MCH: 30.8 pg (ref 26.0–34.0)
MCHC: 33.4 g/dL (ref 30.0–36.0)
MCV: 92.2 fL (ref 80.0–100.0)
Platelets: 267 10*3/uL (ref 150–400)
RBC: 3.83 MIL/uL — AB (ref 3.87–5.11)
RDW: 14.3 % (ref 11.5–15.5)
WBC: 7 10*3/uL (ref 4.0–10.5)
nRBC: 0 % (ref 0.0–0.2)

## 2018-08-24 LAB — URINALYSIS, ROUTINE W REFLEX MICROSCOPIC
Glucose, UA: NEGATIVE mg/dL
Ketones, ur: 15 mg/dL — AB
Nitrite: NEGATIVE
Protein, ur: 30 mg/dL — AB
Specific Gravity, Urine: 1.015 (ref 1.005–1.030)
pH: 6.5 (ref 5.0–8.0)

## 2018-08-24 LAB — TYPE AND SCREEN
ABO/RH(D): O POS
Antibody Screen: NEGATIVE

## 2018-08-24 LAB — URINALYSIS, MICROSCOPIC (REFLEX): Bacteria, UA: NONE SEEN

## 2018-08-24 LAB — POCT FERN TEST: POCT Fern Test: POSITIVE

## 2018-08-24 MED ORDER — SOD CITRATE-CITRIC ACID 500-334 MG/5ML PO SOLN: 30.0000 mL | ORAL | Status: DC | PRN

## 2018-08-24 MED ORDER — MAGNESIUM SULFATE 40 G IN LACTATED RINGERS - SIMPLE
2.0000 g/h | INTRAVENOUS | Status: DC
  Filled 2018-08-24: qty 500

## 2018-08-24 MED ORDER — LACTATED RINGERS IV SOLN: 500.0000 mL | INTRAVENOUS | Status: DC | PRN

## 2018-08-24 MED ORDER — AZITHROMYCIN 250 MG PO TABS
500.0000 mg | ORAL_TABLET | Freq: Every day | ORAL | Status: DC
  Administered 2018-08-24 – 2018-08-26 (×3): 500 mg via ORAL
  Filled 2018-08-24 (×4): qty 2

## 2018-08-24 MED ORDER — DOCUSATE SODIUM 100 MG PO CAPS
100.0000 mg | ORAL_CAPSULE | Freq: Every day | ORAL | Status: DC
  Administered 2018-08-24: 100 mg via ORAL
  Filled 2018-08-24: qty 1

## 2018-08-24 MED ORDER — OXYCODONE-ACETAMINOPHEN 5-325 MG PO TABS: 1.0000 | ORAL_TABLET | ORAL | Status: DC | PRN

## 2018-08-24 MED ORDER — BUTORPHANOL TARTRATE 1 MG/ML IJ SOLN: 1.0000 mg | INTRAMUSCULAR | Status: DC | PRN

## 2018-08-24 MED ORDER — LACTATED RINGERS IV SOLN
INTRAVENOUS | Status: DC
  Administered 2018-08-24 – 2018-08-26 (×6): via INTRAVENOUS

## 2018-08-24 MED ORDER — MAGNESIUM SULFATE BOLUS VIA INFUSION
4.0000 g | Freq: Once | INTRAVENOUS | Status: AC
  Administered 2018-08-24: 4 g via INTRAVENOUS
  Filled 2018-08-24: qty 500

## 2018-08-24 MED ORDER — OXYCODONE-ACETAMINOPHEN 5-325 MG PO TABS: 2.0000 | ORAL_TABLET | ORAL | Status: DC | PRN

## 2018-08-24 MED ORDER — CALCIUM CARBONATE ANTACID 500 MG PO CHEW
2.0000 | CHEWABLE_TABLET | ORAL | Status: DC | PRN
  Administered 2018-08-26: 400 mg via ORAL
  Filled 2018-08-24: qty 2

## 2018-08-24 MED ORDER — ONDANSETRON HCL 4 MG/2ML IJ SOLN: 4.0000 mg | Freq: Four times a day (QID) | INTRAMUSCULAR | Status: DC | PRN

## 2018-08-24 MED ORDER — PRENATAL MULTIVITAMIN CH: 1.0000 | ORAL_TABLET | Freq: Every day | ORAL | Status: DC

## 2018-08-24 MED ORDER — LIDOCAINE HCL (PF) 1 % IJ SOLN
30.0000 mL | INTRAMUSCULAR | Status: DC | PRN
  Filled 2018-08-24: qty 30

## 2018-08-24 MED ORDER — ACETAMINOPHEN 325 MG PO TABS: 650.0000 mg | ORAL_TABLET | ORAL | Status: DC | PRN

## 2018-08-24 MED ORDER — OXYTOCIN BOLUS FROM INFUSION: 500.0000 mL | Freq: Once | INTRAVENOUS | Status: DC

## 2018-08-24 MED ORDER — SODIUM CHLORIDE 0.9 % IV SOLN
2.0000 g | Freq: Four times a day (QID) | INTRAVENOUS | Status: AC
  Administered 2018-08-24 – 2018-08-26 (×8): 2 g via INTRAVENOUS
  Filled 2018-08-24 (×8): qty 2

## 2018-08-24 MED ORDER — ZOLPIDEM TARTRATE 5 MG PO TABS: 5.0000 mg | ORAL_TABLET | Freq: Every evening | ORAL | Status: DC | PRN

## 2018-08-24 MED ORDER — AMOXICILLIN 500 MG PO CAPS
500.0000 mg | ORAL_CAPSULE | Freq: Three times a day (TID) | ORAL | Status: DC
  Administered 2018-08-26 – 2018-08-27 (×2): 500 mg via ORAL
  Filled 2018-08-24 (×3): qty 1

## 2018-08-24 MED ORDER — BETAMETHASONE SOD PHOS & ACET 6 (3-3) MG/ML IJ SUSP
12.0000 mg | Freq: Once | INTRAMUSCULAR | Status: AC
  Administered 2018-08-24: 12 mg via INTRAMUSCULAR
  Filled 2018-08-24: qty 2

## 2018-08-24 MED ORDER — OXYTOCIN 40 UNITS IN NORMAL SALINE INFUSION - SIMPLE MED: 2.5000 [IU]/h | INTRAVENOUS | Status: DC

## 2018-08-24 NOTE — H&P (Addendum)
Meagan Davis is a 43 y.o. female presenting for leaking fluid  43 yo G1P0 @ 31+3 presents with c/o leaking fluid since 1 am. In MAU, she was confirmed PPROM. Her pregnancy has been complicated by AMA, IVF, Cervical incompetence, preterm labor, and gestational hypertension  She was admitted 1/26-1/27 for threatened PTL and received BMZ. During that hospitalization she was noted to have elevated BPs and met criteria for gestational hypertension.   She was noted to have cervical incompetence at her anatomy US at 18 weeks and underwent rescue cerclage  She has a history of myomectomy and is for primary cesarean section for delivery OB History    Gravida  1   Para      Term      Preterm      AB      Living  0     SAB      TAB      Ectopic      Multiple      Live Births             Past Medical History:  Diagnosis Date  . Endometriosis   . Gestational (pregnancy-induced) hypertension without significant proteinuria, complicating childbirth   . HSV infection   . Medical history non-contributory    Past Surgical History:  Procedure Laterality Date  . CERVICAL CERCLAGE N/A 05/24/2018   Procedure: CERCLAGE CERVICAL;  Surgeon: Tama High, MD;  Location: [facility];  Service: Gynecology;  Laterality: N/A;  . HYSTEROSCOPY    . LAPAROSCOPY    . MYOMECTOMY  2019   Family History: family history includes Healthy in her father and mother. Social History:  reports that she has never smoked. She has never used smokeless tobacco. She reports previous alcohol use. She reports that she does not use drugs.     Maternal Diabetes: No Genetic Screening: Normal Maternal Ultrasounds/Referrals: Normal Fetal Ultrasounds or other Referrals:  None Maternal Substance Abuse:  No Significant Maternal Medications:  ASA 81 mg Significant Maternal Lab Results:  None Other Comments:  None  ROS History   Blood pressure 122/89, pulse (!) 109, temperature 98.2 F (36.8 C),  temperature source Oral, resp. rate 18, height 5' 5"  (1.651 m), weight 111.6 kg, last menstrual period 01/19/2018. Exam Physical Exam  Prenatal labs: ABO, Rh: --/--/O POS (01/26 1533) Antibody: NEG (01/26 1533) Rubella:  immune RPR:   NR HBsAg:   Neg HIV:   NR GBS:     Assessment/Plan: 1) Admit 2) SCDs for DVT prophylaxis 3) Amp/Azithro for PPROM 4) Magnesium sulfate for 12 hours for neuroprophylaxis 5) BMZ x 1 for boost for FLM. Pt did receive course on 1/26-1/27 6) NPO 7) MOD: for primary C/S for delivery d/t h/o myomectomy with penetration into endometrial cavity   Vanessa Kick 08/24/2018, 10:48 AM

## 2018-08-24 NOTE — MAU Note (Signed)
Meagan Davis is a 43 y.o. at [redacted]w[redacted]d here in MAU reporting: clear/yellow discharge since 0100. States fluid is still leaking out and runs down her leg. Having some pain in her lower back. + FM  Onset of complaint: this AM  Pain score: 1/10  Vitals:   08/24/18 0957  BP: 122/89  Pulse: (!) 109  Resp: 18  Temp: 98.2 F (36.8 C)      Lab orders placed from triage: UA

## 2018-08-24 NOTE — MAU Provider Note (Signed)
Chief Complaint:  Back Pain and Rupture of Membranes   First Provider Initiated Contact with Patient 08/24/18 1021     HPI: Meagan Davis is a 43 y.o. G1P0 at 105w3d who presents to maternity admissions reporting leaking fluid. Leakage of water fluid since 1 am, has turned pink tinged since this morning. Has felt some abdominal tightening and lower abdominal cramping. Denies abdominal pain or contractions. She has a cerclage in place. Scheduled for primary c/section d/t hx of myomectomy. .  Denies contractions, leakage of fluid or vaginal bleeding. Good fetal movement.  Location: low back Quality: discomfort Severity: 1/10 in pain scale Duration: 2 hours Timing: constant Modifying factors: none Associated signs and symptoms: leaking fluid  Pregnancy Course: recently diagnosed with gestational hypertension. Cerclage in place d/t short cervix. Planned c/section for hx of myomectomy. Received BMZ 1/26 & 1/27  Past Medical History:  Diagnosis Date  . Endometriosis   . Gestational (pregnancy-induced) hypertension without significant proteinuria, complicating childbirth   . HSV infection   . Medical history non-contributory    OB History  Gravida Para Term Preterm AB Living  1         0  SAB TAB Ectopic Multiple Live Births               # Outcome Date GA Lbr Len/2nd Weight Sex Delivery Anes PTL Lv  1 Current            Past Surgical History:  Procedure Laterality Date  . CERVICAL CERCLAGE N/A 05/24/2018   Procedure: CERCLAGE CERVICAL;  Surgeon: Tama High, MD;  Location: [facility];  Service: Gynecology;  Laterality: N/A;  . HYSTEROSCOPY    . LAPAROSCOPY    . MYOMECTOMY  2019   Family History  Problem Relation Age of Onset  . Healthy Mother   . Healthy Father    Social History   Tobacco Use  . Smoking status: Never Smoker  . Smokeless tobacco: Never Used  Substance Use Topics  . Alcohol use: Not Currently    Comment: Social  . Drug use: Never   Allergies   Allergen Reactions  . Egg Yolk Cough  . Adhesive [Tape] Rash    rash  . Oxycodone-Acetaminophen Nausea And Vomiting   Medications Prior to Admission  Medication Sig Dispense Refill Last Dose  . acetaminophen (TYLENOL) 500 MG tablet Take 500 mg by mouth every 6 (six) hours as needed for moderate pain.   08/09/2018 at Unknown time  . aspirin EC 81 MG tablet Take 81 mg by mouth daily.   08/10/2018 at Unknown time  . docusate sodium (COLACE) 100 MG capsule Take 100 mg by mouth daily.   Past Week at Unknown time  . famotidine (PEPCID) 20 MG tablet Take 20 mg by mouth daily as needed for heartburn.    Past Week at Unknown time  . glycerin adult 2 g suppository Place 1 suppository rectally as needed for constipation.   Past Week at Unknown time  . magnesium hydroxide (MILK OF MAGNESIA) 400 MG/5ML suspension Take 15 mLs by mouth daily as needed for mild constipation.   Past Week at Unknown time  . Prenatal Vit-Fe Fumarate-FA (PRENATAL MULTIVITAMIN) TABS tablet Take 1 tablet by mouth daily at 12 noon.   08/10/2018 at Unknown time    I have reviewed patient's Past Medical Hx, Surgical Hx, Family Hx, Social Hx, medications and allergies.   ROS:  Review of Systems  Constitutional: Negative.   Gastrointestinal: Negative.   Genitourinary: Positive  for vaginal discharge. Negative for dysuria and vaginal bleeding.  Musculoskeletal: Positive for back pain.    Physical Exam   Patient Vitals for the past 24 hrs:  BP Temp Temp src Pulse Resp Height Weight  08/24/18 0957 122/89 98.2 F (36.8 C) Oral (!) 109 18 - -  08/24/18 0953 - - - - - 5\' 5"  (1.651 m) 111.6 kg    Constitutional: Well-developed, well-nourished female in no acute distress.  Cardiovascular: normal rate & rhythm, no murmur Respiratory: normal effort, lung sounds clear throughout GI: Abd soft, non-tender, gravid appropriate for gestational age. Pos BS x 4 MS: Extremities nontender, no edema, normal ROM Neurologic: Alert and oriented  x 4.  GU:    Pooling of yellow tinged fluid. Cervix visually closed. Digital exam deferred.   NST:  Baseline: 140 bpm, Variability: Good {> 6 bpm), Accelerations: 10x10 and Decelerations: Absent   Labs: Results for orders placed or performed during the hospital encounter of 08/24/18 (from the past 24 hour(s))  POCT fern test     Status: Abnormal   Collection Time: 08/24/18 10:32 AM  Result Value Ref Range   POCT Fern Test Positive = ruptured amniotic membanes     Imaging:  No results found.  MAU Course: Orders Placed This Encounter  Procedures  . Urinalysis, Routine w reflex microscopic  . Diet NPO time specified  . Notify physician (specify)  . Vital signs  . Defer vaginal exam for vaginal bleeding or PROM <37 weeks  . Initiate Oral Care Protocol  . Initiate Carrier Fluid Protocol  . Cervical Exam  . SCDs  . Continuous tocometry  . Bed rest with bathroom privileges  . Full code  . POCT fern test  . Fetal nonstress test  . Admit to Inpatient (patient's expected length of stay will be greater than 2 midnights or inpatient only procedure)   Meds ordered this encounter  Medications  . acetaminophen (TYLENOL) tablet 650 mg  . zolpidem (AMBIEN) tablet 5 mg  . docusate sodium (COLACE) capsule 100 mg  . calcium carbonate (TUMS - dosed in mg elemental calcium) chewable tablet 400 mg of elemental calcium  . prenatal multivitamin tablet 1 tablet  . magnesium bolus via infusion 4 g  . magnesium sulfate 40 grams in LR 500 mL OB infusion  . betamethasone acetate-betamethasone sodium phosphate (CELESTONE) injection 12 mg  . FOLLOWED BY Linked Order Group   . ampicillin (OMNIPEN) 2 g in sodium chloride 0.9 % 100 mL IVPB     Order Specific Question:   Antibiotic Indication:     Answer:   Other Indication (list below)     Order Specific Question:   Other Indication:     Answer:   PPROM   . amoxicillin (AMOXIL) capsule 500 mg  . azithromycin (ZITHROMAX) tablet 500 mg    MDM: Pt  grossly rupture & positive fern. Cervix visually closed. Digital exam deferred.  Dr. Harrington Challenger notified of exam. NICU high census signs are now down, will admit to birthing suites.   Assessment/Plan 1. Preterm premature rupture of membranes in third trimester, unspecified duration to onset of labor  -Dr. Harrington Challenger to place admission orders. Patient & family notified of plan  2. [redacted] weeks gestation of pregnancy       Jorje Guild, NP 08/24/2018 10:54 AM

## 2018-08-25 ENCOUNTER — Other Ambulatory Visit: Payer: Self-pay

## 2018-08-25 LAB — GROUP B STREP BY PCR: Group B strep by PCR: NEGATIVE

## 2018-08-25 LAB — RPR: RPR Ser Ql: NONREACTIVE

## 2018-08-25 MED ORDER — DOCUSATE SODIUM 100 MG PO CAPS
100.0000 mg | ORAL_CAPSULE | Freq: Two times a day (BID) | ORAL | Status: DC
  Administered 2018-08-25 – 2018-08-26 (×3): 100 mg via ORAL
  Filled 2018-08-25 (×3): qty 1

## 2018-08-25 MED ORDER — GLYCERIN (ADULT) 2 G RE SUPP
1.0000 | RECTAL | Status: DC | PRN
  Filled 2018-08-25: qty 1

## 2018-08-25 MED ORDER — VALACYCLOVIR HCL 500 MG PO TABS
500.0000 mg | ORAL_TABLET | Freq: Two times a day (BID) | ORAL | Status: DC
  Administered 2018-08-25 – 2018-08-26 (×4): 500 mg via ORAL
  Filled 2018-08-25 (×4): qty 1

## 2018-08-25 MED ORDER — FAMOTIDINE 20 MG PO TABS: 20.0000 mg | ORAL_TABLET | Freq: Two times a day (BID) | ORAL | Status: DC | PRN

## 2018-08-25 MED ORDER — POLYETHYLENE GLYCOL 3350 17 G PO PACK
17.0000 g | PACK | Freq: Three times a day (TID) | ORAL | Status: DC
  Administered 2018-08-25: 17 g via ORAL
  Filled 2018-08-25: qty 1

## 2018-08-25 NOTE — Lactation Note (Addendum)
Lactation Consultation Note  Patient Name: Ngozi Alvidrez DTOIZ'T Date: 08/25/2018   Patient is [redacted] weeks pregnant.  Took a breastfeeding class in [place] and was planning on being able to go until at least 37 weeks. Patient request to see lactation. Patient reprots she does not know what to do now as far as breastfeeding goes since she is going to have an early baby that is going to NICU.  Patient reports she does not have a DEBP yet.  Patient reports her insurance will provide one. Patient asked about DEBP brands and report she is between a medela and a spectra breastpump.  Discussed option of pump rental also for first few weeks. .  Patient asked about things she could take now to help her have more milk later.  Explained the most important thing for now is to be prepared and try and initiate pumping within 1 hour past birth, definitely by 6 hours.  Patient reports she will have a csection.  Urged patient to discuss her pumping and breastfeeding wishes with her family and OB/GYN.  Urged mom and partner to watch [place] on pumping video.  Reviewed and gave NICU booklet.  Urged to pump 10 times day for 15 minutes to establish good production. Demo how to use DEBP as far as turning it on, initiate setting, and turning up. Urged mom to call lactation as needed.   Maternal Data    Feeding    LATCH Score                   Interventions    Lactation Tools Discussed/Used     Consult Status      Rollen Sox 08/25/2018, 10:43 PM

## 2018-08-25 NOTE — Anesthesia Pain Management Evaluation Note (Signed)
  CRNA Pain Management Visit Note  Patient: Meagan Davis, 43 y.o., female  "Hello I am a member of the anesthesia team at [facility]. We have an anesthesia team available at all times to provide care throughout the hospital, including epidural management and anesthesia for C-section. I don't know your plan for the delivery whether it a natural birth, water birth, IV sedation, nitrous supplementation, doula or epidural, but we want to meet your pain goals."   1.Was your pain managed to your expectations on prior hospitalizations?   No prior hospitalizations  2.What is your expectation for pain management during this hospitalization?     Spinal for C-Section  3.How can we help you reach that goal? The patient is here for PROM and plans to remain in the hospital until delivery by C/S.  Record the patient's initial score and the patient's pain goal.   Pain: 0  Pain Goal: 0 [facility] wants you to be able to say your pain was always managed very well.  Otis Portal 08/25/2018

## 2018-08-25 NOTE — Consult Note (Signed)
Neonatology Consult  Note:  At the request of the patients obstetrician Dr. Carlis Abbott I met with Meagan Davis who is at 94 4 weeks currently with pregnancy complicated by PPROM, AMA, IVF, Cervical incompetence, preterm labor, and gestational hypertension.  She was noted to have cervical incompetence at her anatomy US at 18 weeks and underwent rescue cerclage.  Current treatment includes Amp/Azithro for PPROM, magnesium sulfate for 12 hours for neuroprophylaxis, betamethasone x 1 (received a course on 1/26-1/27). We reviewed initial delivery room management, including CPAP, [place], and low but certainly possible need for intubation for surfactant administration.  We discussed feeding immaturity and need for full po intake with multiple days of good weight gain and no apnea or bradycardia before discharge.  We reviewed increased risk of jaundice, infection, and temperature instability.   Discussed likely length of stay.  Thank you for allowing [place] to participate in her care.  Please call with questions.  Higinio Roger, DO  Neonatologist   The total length of face-to-face or floor / unit time for this encounter was 30 minutes.  Counseling and / or coordination of care was greater than fifty percent of the time.

## 2018-08-25 NOTE — Progress Notes (Signed)
43 y.o. G1P0 [redacted]w[redacted]d HD#1 admitted for water broke.  Admitted with PPROM yesterday morning (+ fern and + pool).  Denies any concerns since admission.  +FM, no vaginal bleeding, no abdominal pain / contractions, no fevers or chills..  She was admitted on 1/26 for preterm contractions--she never changed her cervix nor did she feel any of the contractions.  She is having intermittent contractions on the monitor since admission, but again denies feeling any.   Vitals:   08/24/18 2306 08/25/18 0008 08/25/18 0244 08/25/18 0602  BP: 111/76 (!) 116/50 124/77 120/90  Pulse: 97 90 88 86  Resp: 16 16  16   Temp: 97.9 F (36.6 C)  97.6 F (36.4 C) 98.1 F (36.7 C)  TempSrc: Oral  Oral Oral  Weight:      Height:        NAD Abd  Soft, gravid, no fundal tenderness Ex SCDs FHTs  120s, moderate variability, + accels, occ short variable deceleration.  OVerall reassuring Toco  Uterine irritability   Results for orders placed or performed during the hospital encounter of 08/24/18 (from the past 24 hour(s))  Urinalysis, Routine w reflex microscopic     Status: Abnormal   Collection Time: 08/24/18 10:07 AM  Result Value Ref Range   Color, Urine AMBER (A) YELLOW   APPearance CLEAR CLEAR   Specific Gravity, Urine 1.015 1.005 - 1.030   pH 6.5 5.0 - 8.0   Glucose, UA NEGATIVE NEGATIVE mg/dL   Hgb urine dipstick TRACE (A) NEGATIVE   Bilirubin Urine SMALL (A) NEGATIVE   Ketones, ur 15 (A) NEGATIVE mg/dL   Protein, ur 30 (A) NEGATIVE mg/dL   Nitrite NEGATIVE NEGATIVE   Leukocytes, UA SMALL (A) NEGATIVE  Urinalysis, Microscopic (reflex)     Status: None   Collection Time: 08/24/18 10:07 AM  Result Value Ref Range   RBC / HPF 0-5 0 - 5 RBC/hpf   WBC, UA 6-10 0 - 5 WBC/hpf   Bacteria, UA NONE SEEN NONE SEEN   Squamous Epithelial / LPF 0-5 0 - 5   Mucus PRESENT   POCT fern test     Status: Abnormal   Collection Time: 08/24/18 10:32 AM  Result Value Ref Range   POCT Fern Test Positive = ruptured  amniotic membanes   CBC     Status: Abnormal   Collection Time: 08/24/18 11:28 AM  Result Value Ref Range   WBC 7.0 4.0 - 10.5 K/uL   RBC 3.83 (L) 3.87 - 5.11 MIL/uL   Hemoglobin 11.8 (L) 12.0 - 15.0 g/dL   HCT 35.3 (L) 36.0 - 46.0 %   MCV 92.2 80.0 - 100.0 fL   MCH 30.8 26.0 - 34.0 pg   MCHC 33.4 30.0 - 36.0 g/dL   RDW 14.3 11.5 - 15.5 %   Platelets 267 150 - 400 K/uL   nRBC 0.0 0.0 - 0.2 %  Type and screen [place]     Status: None   Collection Time: 08/24/18 11:28 AM  Result Value Ref Range   ABO/RH(D) O POS    Antibody Screen NEG    Sample Expiration      08/27/2018 Performed at [facility], [address], [place], [place] [address]   RPR     Status: None   Collection Time: 08/24/18 11:28 AM  Result Value Ref Range   RPR Ser Ql Non Reactive Non Reactive    A:  G1 @ [redacted]w[redacted]d with PPROM day #2  P: PPROM: continue  latency antibiotics.   No signs of intra-amniotic infection, abruption or labor. Prematurity: received BMZ 1/26-27, booster yesterday on 2/9.  Receive 12 hours of magnesium sulfate on admission. She did not have a gbs swab collected on admission.  Will collect now, but if negative, would consider still treating as gbs unknown Fibroids: Multiple uterine fibroids.  Largest is 8.7 cm, left lower uterine segment. Will obtain growth [place] today.  Of note, prior operative reports note stage 4 endometriosis.  Cerclage: will need to remove at time of cesarean section. Was visually closed on admission History of genital herpes:  Will start valtrex suppression now Constipation: long history of severe chronic constipation.  Sees GI at [facility]  Is on colace BID and miralax 17gm TID.  Will restart Gestational hypertension: Bps mild range since admission.  Will monitor and obtain LFTs  SCDs Continuous fetal monitoring. Will transfer to antepartum at this time  [place]

## 2018-08-26 ENCOUNTER — Inpatient Hospital Stay (HOSPITAL_COMMUNITY): Payer: BLUE CROSS/BLUE SHIELD

## 2018-08-26 DIAGNOSIS — O99213 Obesity complicating pregnancy, third trimester: Secondary | ICD-10-CM

## 2018-08-26 DIAGNOSIS — O09523 Supervision of elderly multigravida, third trimester: Secondary | ICD-10-CM

## 2018-08-26 DIAGNOSIS — O09813 Supervision of pregnancy resulting from assisted reproductive technology, third trimester: Secondary | ICD-10-CM

## 2018-08-26 DIAGNOSIS — O3433 Maternal care for cervical incompetence, third trimester: Secondary | ICD-10-CM

## 2018-08-26 DIAGNOSIS — Z3A31 31 weeks gestation of pregnancy: Secondary | ICD-10-CM

## 2018-08-26 MED ORDER — BETAMETHASONE SOD PHOS & ACET 6 (3-3) MG/ML IJ SUSP
12.0000 mg | Freq: Once | INTRAMUSCULAR | Status: AC
  Administered 2018-08-26: 12 mg via INTRAMUSCULAR
  Filled 2018-08-26: qty 2

## 2018-08-26 NOTE — Progress Notes (Signed)
Verified with MFM ok for transfer, not requiring continuous monitoring, NST reactive.

## 2018-08-26 NOTE — Progress Notes (Signed)
Called to room by patient to show me pad with increased pink/red bleeding. Pt denies any pain. Dr. Rogue Bussing called but in OR at this time. Her phone was answered by Nira Conn, RN and she stated she would tell her. No new orders given. Toya Smothers, RN

## 2018-08-26 NOTE — Progress Notes (Signed)
Report called to [place], Therapist, sports at [place], RN

## 2018-08-26 NOTE — Progress Notes (Signed)
Called to see patient for additional pink/rust colored bleeding, examined pad which had some staining, but not large amount.  Pt states no further bleeding and no pain or contractions. After our discussion this morning, patient and her husband came to conclusion that they would like to transfer to [facility] given potential for surgical complications, especially if an emergent situation were to occur.  Pt currently stable. Discussed acuity and potential for complications with [facility] physician Dr. Raphael Gibney and transfer of care accepted.

## 2018-08-26 NOTE — Progress Notes (Signed)
HD#2 G1P0 @ 31.5 admitted for PPROM with cerclage in place Pt is comfortable without complaints.  Denies VB, denies any persistent LOF, denies any contractions. Denies fever, abd tenderness. Good FM.  Denies HA/vision change or RUQ pain.  Did not report any difficulties with constipation today. Patient is eating, ambulating, voiding.  Pain control is good.  Vitals:   08/25/18 1941 08/25/18 2321 08/26/18 0642 08/26/18 0821  BP: 121/88 118/83 108/83 (!) 117/95  Pulse: 90 90 81   Resp: 17 18 17 18   Temp: 98.5 F (36.9 C) 98.4 F (36.9 C) 97.9 F (36.6 C) 98.1 F (36.7 C)  TempSrc: Oral Oral Oral Oral  SpO2: 99% 98% 95%   Weight:      Height:        Abd: gravid, NT Ext: no calf tenderness  Lab Results  Component Value Date   WBC 7.0 08/24/2018   HGB 11.8 (L) 08/24/2018   HCT 35.3 (L) 08/24/2018   MCV 92.2 08/24/2018   PLT 267 08/24/2018    --/--/O POS (02/09 1128)/  A/P HD #2 PPROM and GHTN, cerclage in place Per records: h.o stage 4 endometriosis and bowel resection at time of surgery, h.o myomectomy, known 8cm fibroid LUS.  Rescue cerclage placed by MFM approx 18 weeks.  Discussed with patient the possibility of complicated surgery given history and potential for bowel/bladder injury.  Discussion of potential TOC to [facility] where prior surgery has been performed given access to additional surgrical specialities. Discussed special concern for complications if had to proceed to c/s in emergency.  Discussed with Dr. Donalee Citrin, earliest delivery recommended in 34 wks.  He stated if pt does not transfer to [facility], he will see her Friday for repeat scan, and recommends cerclage remove Monday 2/17 barring any new development of ctx or signs of chorioamnionitis. Discussed with other [facility] team members potential transfer.  Her primary physician Dr. Philis Pique, states she will discuss with Thursday if not transferred and stated she is available this week to help with c/s if needed. HSV- asymptomatic,  continue prophylactic valtrex. Constipation- continue current bowel regimen S/p betamethasone 1/26-1/27- received add'l dose 2/9.  Dr. Donalee Citrin recommends completing that course, final dose ordered for today. S/p MgSO4 at admission for 12 hours Cont. Latency abx for PPROM Cont. Current monitoring.  Routine care.    Allyn Kenner

## 2018-08-26 NOTE — Consult Note (Signed)
Maternal-Fetal Medicine  Name: Meagan Davis MRN: [id number] Requesting Provider: Jerelyn Charles, MD  Ms. Stansbery was admitted 2 days ago with the diagnosis of PPROM. Patient reported leakage of amniotic fluid. MAU exam showed "pooling of yellow-tinged fluid." Patient does not have uterine contractions or fever or abdominal pain.   Since admission, she is receiving prophylactic ampicillin and azithromycin. She also received 1 dose of betamethasone.  She also has a diagnosis of gestational hypertension. Her blood pressures have been normal to mild hypertensive range.  Obstetric history is significant for rescue cerclage at 18-week's gestation. Past surgical history includes myomectomy.  P/E: Patient is comfortably lying in bed; not in pain. Vitals stable Abd: Soft gravid uterus; no tenderness. No pedal edema. NST is reactive.  Labs: WBC 7, Hb 11.8, Hct 35.3, PLT 267, GBS negative.  On ultrasound, amniotic fluid is normal and good fetal activity is seen. Breech presentation. Fetal growth is appropriate for gestational age. Fetal breathing movements did not meet the crieteria. BPP (including NST) is 8/10 (reassuring).  I counseled the patient on the following:  PPROM: -Explained the diagnosis of PPROM, management and timing of delivery. Although the patient does not report persistent leakage of amniotic fluid, initial examination at MAU confirmed PPROM (pooling). -Antibiotics increases the latency period (rupture-delivery interval). -About 70% or more patients go into spontaneous labor within 1 week. -If expectant management is successful, we recommend delivery at 34 weeks as risk from intraamniotic infection outweighs the neonatal benefits at that gestational age. -Steroids improve neonatal outcome. -Complications of PPROM include intraamniotic infections, fetal infection, placental abruption, fetal demise (cord prolapse).  PPROM and Cerclage:  I informed the patient that no clear  recommendations exist on the management of patients with PPROM with cerclage in place. One nonrandomized trial showed increase in the latency period (prolongation of pregnancy). It is also possible that risk of sepsis is increased with cerclage in place. ACOG does not have a clear recommendation on the management and both courses (removal or retention) have been adopted by different physicians.  My recommendation would be NOT to remove the cerclage now till 9 weeks' gestation.  Patient opted not to have the cerclage removed. She understands that cerclage will be removed if she goes into active labor or has signs of chorioamnionitis.  Recommendations: -Continue amoxicillin PO for a total of 5 days. -Consider azithromycin 1 gram PO one dose and stop. -Twice-daily NST -BPP on 08/29/18 and then weekly. -Close monitoring for signs and symptoms of maternal infection. -Cerclage removal at 32 weeks. -Delivery at 34 weeks. -Complete betamethasone course and give one dose of betamethasone 12 mg intramuscular now. -Ambulate as tolerated.  Thank you for your consult. Please do not hesitate to contact me if you have any questions or concerns.  Consultation including face-to-face counseling: 35 min.

## 2018-08-27 NOTE — Discharge Summary (Signed)
Pt was admitted for PPROM at 31.3 with cerclage in place, GHTN, genital HSV, known fibroid in LUS and extensive surgical history including LOA for stage 4 endometriosis..  Given the potential for surgical complications if emergent cesarean was needed, the decision was made to transfer patient to facility with additional specialized surgical care, [facility].  Patient was also a known patient there and had had prior surgery there.   [facility] accepted pt and she was transferred hospital day #3 in stable condition.

## 2018-08-27 NOTE — Progress Notes (Signed)
Report given to paramedic with stable vital signs. Patient on way to [facility]. Report called to Judson Roch, RN on labor and delivery.

## 2018-10-06 ENCOUNTER — Encounter (HOSPITAL_COMMUNITY): Admission: RE | Admit: 2018-10-06 | Payer: BLUE CROSS/BLUE SHIELD | Source: Ambulatory Visit

## 2018-10-07 ENCOUNTER — Inpatient Hospital Stay (HOSPITAL_COMMUNITY): Admit: 2018-10-07 | Payer: BLUE CROSS/BLUE SHIELD | Admitting: Obstetrics and Gynecology

## 2018-10-07 ENCOUNTER — Encounter (HOSPITAL_COMMUNITY): Payer: Self-pay

## 2018-10-07 SURGERY — Surgical Case
Anesthesia: Regional

## 2019-01-15 ENCOUNTER — Encounter (HOSPITAL_COMMUNITY): Payer: Self-pay

## 2019-01-22 IMAGING — US US MFM OB TRANSVAGINAL
1 series · 13 of 28 positions shown · non-contrast
Comparison: none

[Series 1: us mfm ob transvaginal · 13 of 65 slices shown]
[im 3/65]
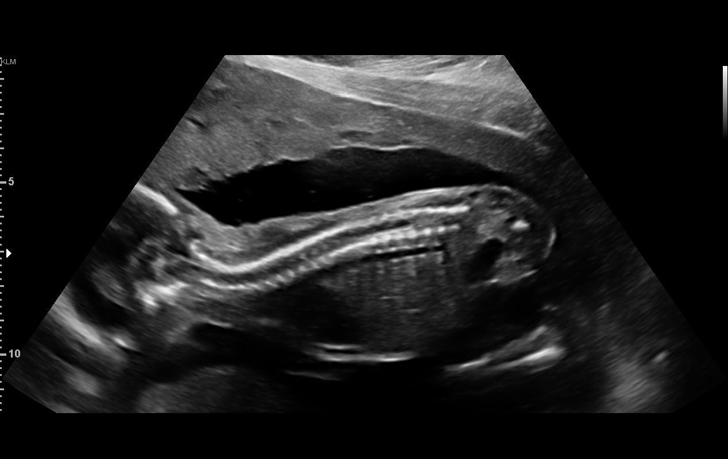
[im 8/65]
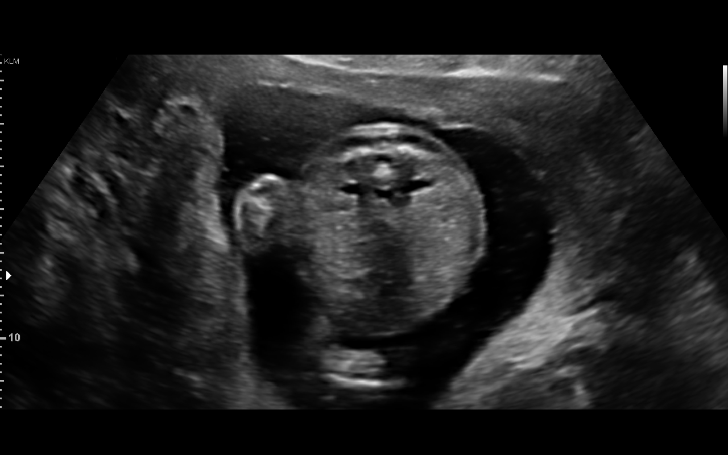
[im 12/65]
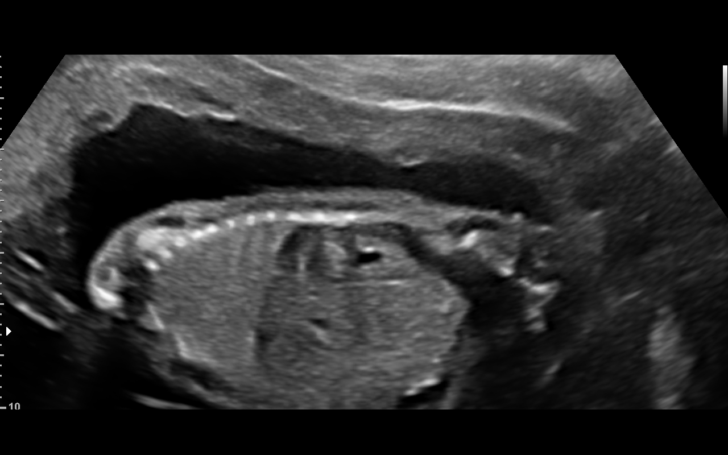
[im 17/65]
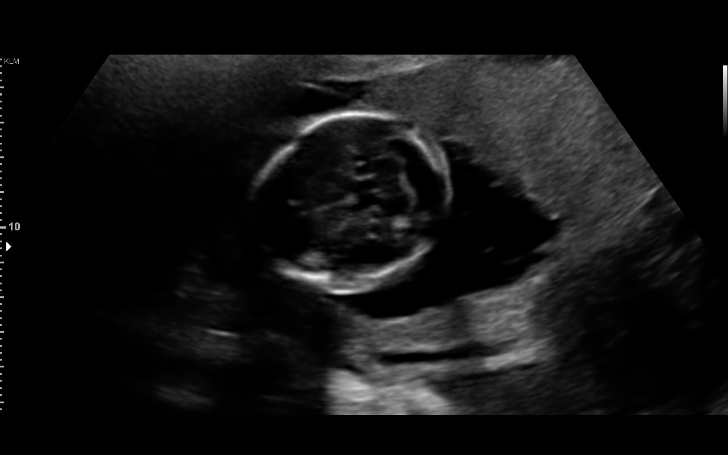
[im 22/65]
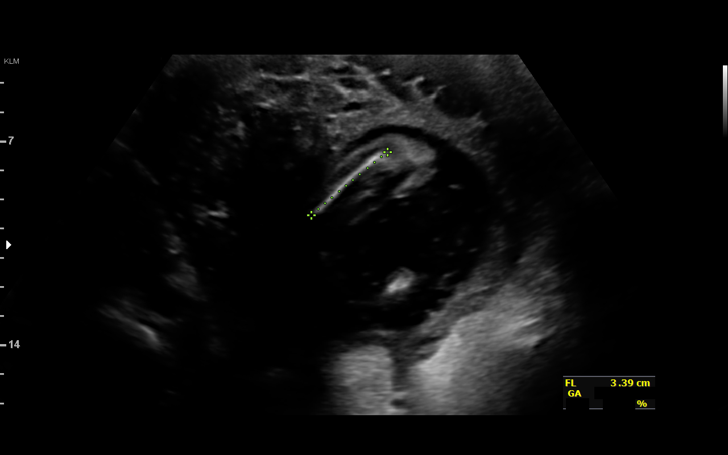
[im 27/65]
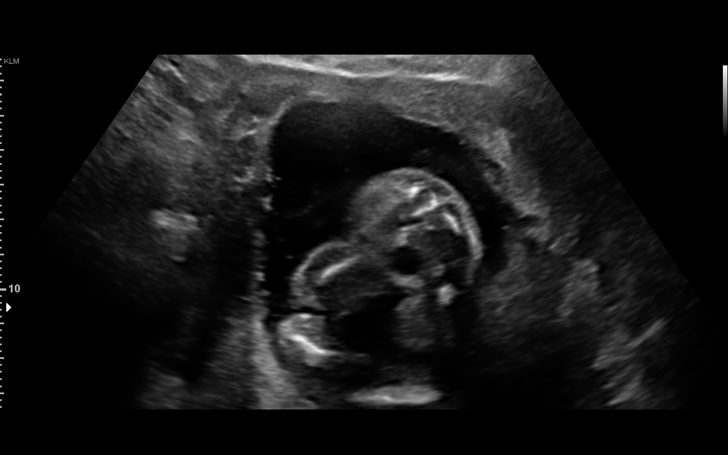
[im 34/65]
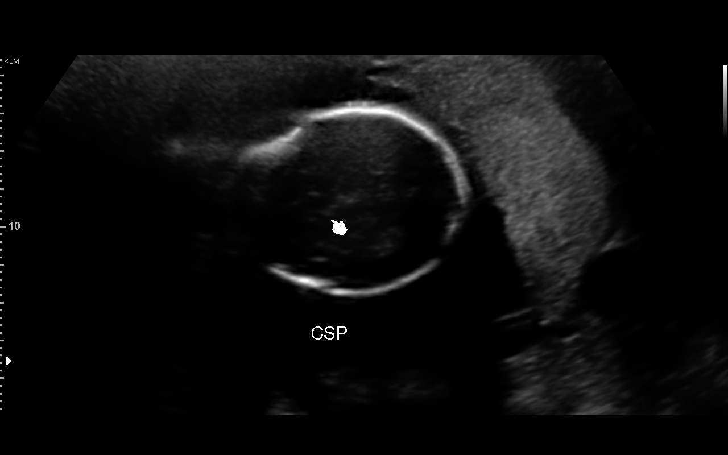
[im 38/65]
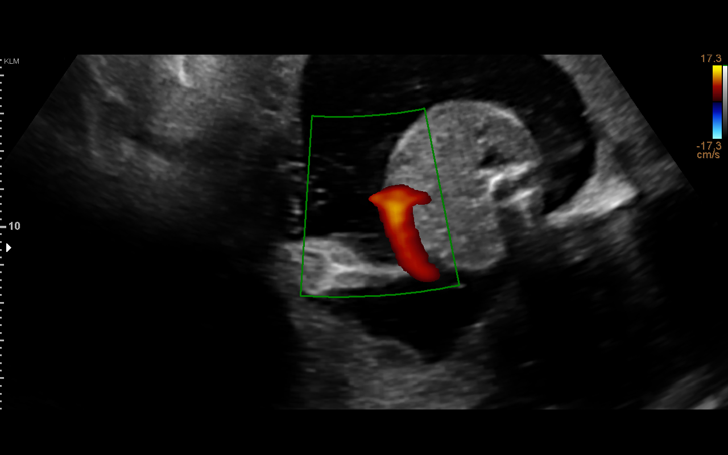
[im 43/65]
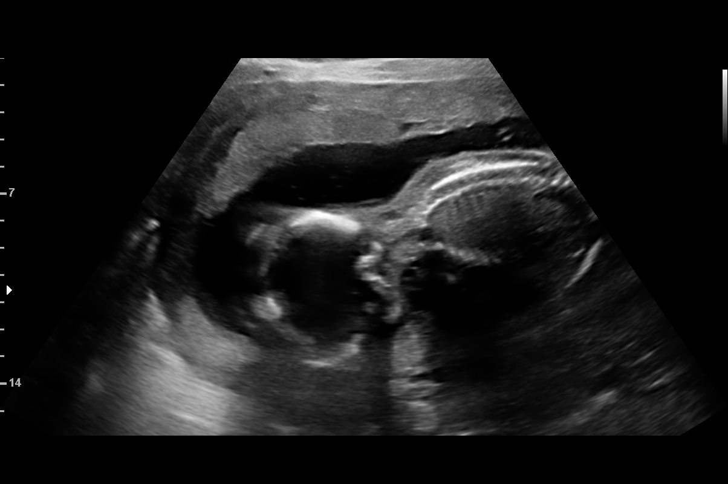
[im 48/65]
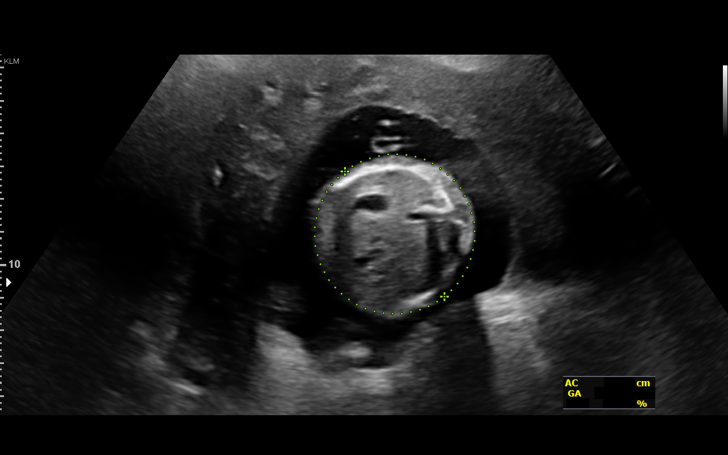
[im 53/65]
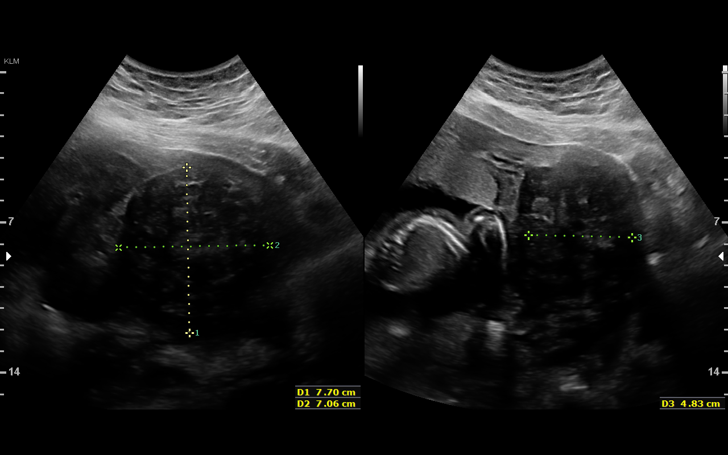
[im 57/65]
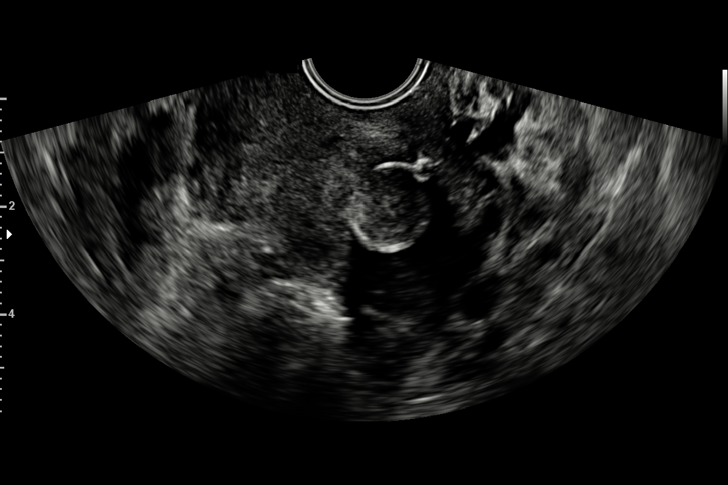
[im 62/65]
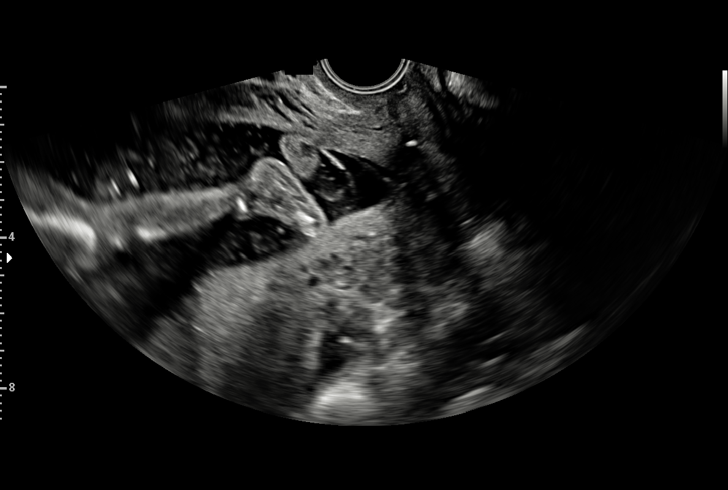

[13 of 28 positions shown; findings below may reference images not displayed]

2  US MFM OB TRANSVAGINAL               76817.2      HAILEMELEKOT MEDVEDOVSKY
 ----------------------------------------------------------------------

 ----------------------------------------------------------------------
Indications

  Encounter for antenatal screening for
  malformations
  20 weeks gestation of pregnancy
  Cervical cerclage suture present, second
  trimester (placed [DATE])
  Advanced maternal age multigravida 35+,
  second trimester
  Pregnancy resulting from assisted
  reproductive technology (IVF, low risk NIPS)
  Obesity complicating pregnancy, second
  trimester
 ----------------------------------------------------------------------
Fetal Evaluation

 Num Of Fetuses:         1
 Fetal Heart Rate(bpm):  155
 Cardiac Activity:       Observed
 Presentation:           Breech
 Placenta:               Anterior
 P. Cord Insertion:      Visualized

 Amniotic Fluid
 AFI FV:      Within normal limits
Biometry

 BPD:      47.5  mm     G. Age:  20w 2d         59  %    CI:        72.11   %    70 - 86
                                                         FL/HC:      18.8   %    16.8 -
 HC:       178   mm     G. Age:  20w 2d         48  %    HC/AC:      1.16        1.09 -
 AC:       154   mm     G. Age:  20w 4d         59  %    FL/BPD:     70.3   %
 FL:       33.4  mm     G. Age:  20w 3d         54  %    FL/AC:      21.7   %    20 - 24
 HUM:      32.6  mm     G. Age:  21w 0d         74  %

 Est. FW:     358  gm    0 lb 13 oz      54  %
OB History

 Gravidity:    1
Gestational Age

 U/S Today:     20w 3d                                        EDD:   10/21/18
 Best:          20w 1d     Det. By:  Previous Ultrasound      EDD:   10/23/18
Anatomy

 Cranium:               Appears normal         Aortic Arch:            Appears normal
 Cavum:                 Appears normal         Ductal Arch:            Appears normal
 Ventricles:            Appears normal         Diaphragm:              Appears normal
 Choroid Plexus:        Appears normal         Stomach:                Appears normal, left
                                                                       sided
 Cerebellum:            Appears normal         Abdomen:                Appears normal
 Posterior Fossa:       Appears normal         Abdominal Wall:         Appears nml (cord
                                                                       insert, abd wall)
 Nuchal Fold:           Not applicable (>20    Cord Vessels:           Appears normal (3
                        wks GA)                                        vessel cord)
 Face:                  Not well visualized    Kidneys:                Appear normal
 Lips:                  Not well visualized    Bladder:                Appears normal
 Thoracic:              Appears normal         Spine:                  Appears normal
 Heart:                 Appears normal         Upper Extremities:      Appears normal
                        (4CH, axis, and situs
 RVOT:                  Not well visualized    Lower Extremities:      Appears normal
 LVOT:                  Not well visualized

 Other:  Parents do not wish to know sex of fetus. Hands not well visualized.
         Heels visualized. Technically difficult due to maternal habitus and
         fetal position.
Cervix Uterus Adnexa

 Cervix
 Length:            1.2  cm.
 Measured transvaginally. Cerclage visualized.

 Uterus
 Multiple fibroids noted, see table below.

 Adnexa
 No abnormality visualized.
Myomas

  Site                     L(cm)      W(cm)      D(cm)      Location
  LUS
  Posterior
  Right                    4.9        4.6        4
 ----------------------------------------------------------------------

  Blood Flow                 RI        PI       Comments
 ----------------------------------------------------------------------
Impression

 Patient had rescue cerclage 2 weeks ago. Cervical length
 measurement before cerclage was 7 millimeters. Patient
 does not have uterine contractions or vaginal bleeding.

 We performed a fetal anatomy scan. No markers of
 aneuploidies or fetal structural defects are seen. Fetal
 biometry is consistent with her previously-established dates.
 Amniotic fluid is normal and good fetal activity is seen.
 Multiple myomas are seen.

 We performed transvaginal ultrasound to evaluate the cervix.
 The cervix measures 1.2 cm and the cerclage is in place (7
 mm from the external os). Funneling is seen and is proximal
 to the stitch.

 I explained the findings with help of ultrasound images. I
 reassured the couple. I also informed the patient that she still
 carries a higher risk of preterm delivery.
Recommendations

 An appointment was made for her to return in 4 weeks for
 completion of fetal anatomy.
                 Pino, Francis Kojo

## 2019-02-19 IMAGING — US US MFM OB FOLLOW-UP
1 series · 13 of 28 positions shown · non-contrast
Comparison: none

[Series 1: us mfm ob follow-up · 65 acquisitions, 13 frames shown]
[im 3/65]
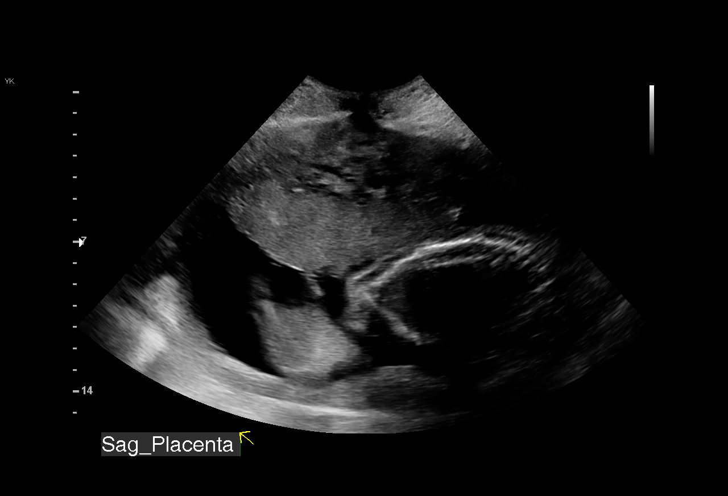
[im 8/65]
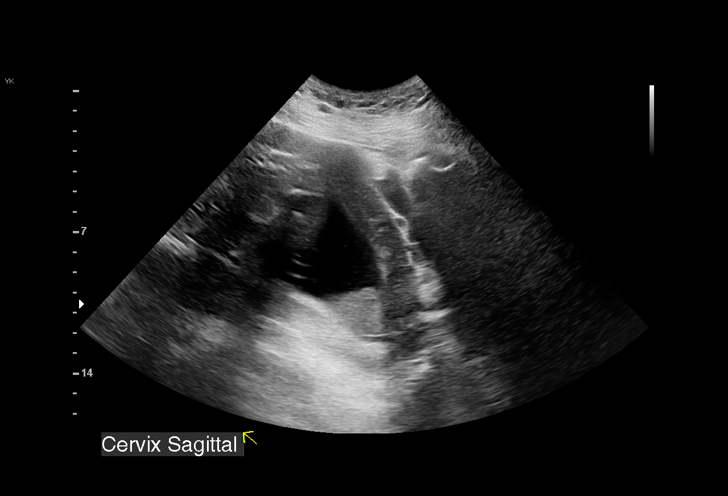
[im 12/65]
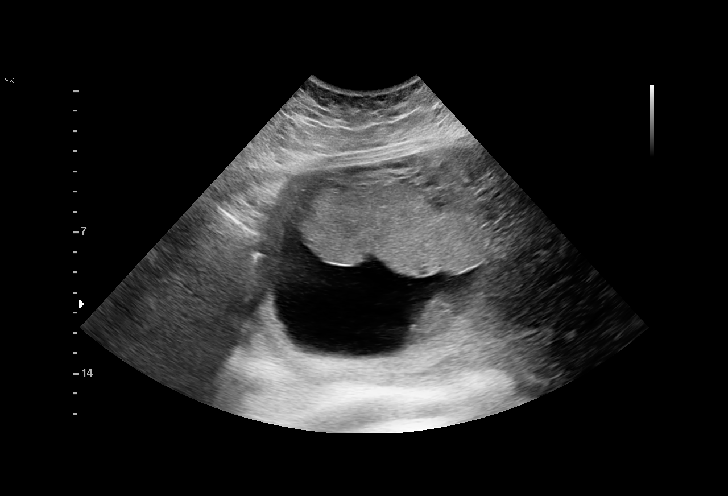
[im 17/65]
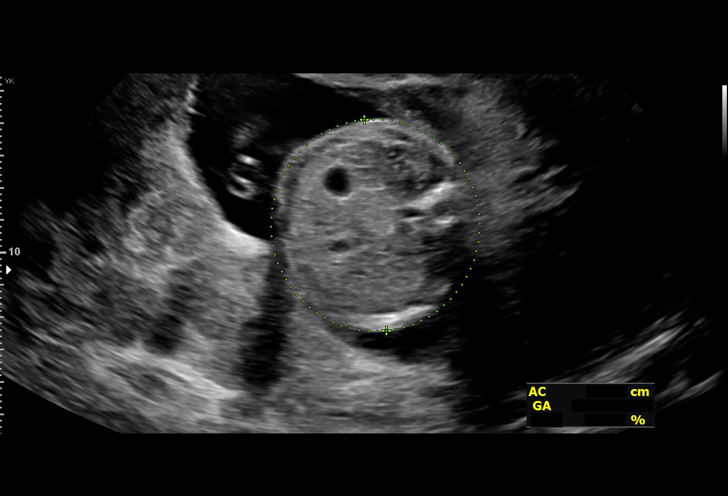
[im 22/65]
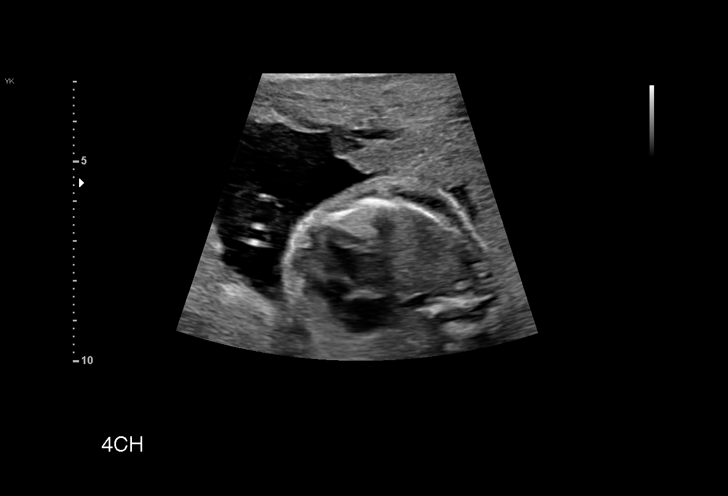
[im 27/65]
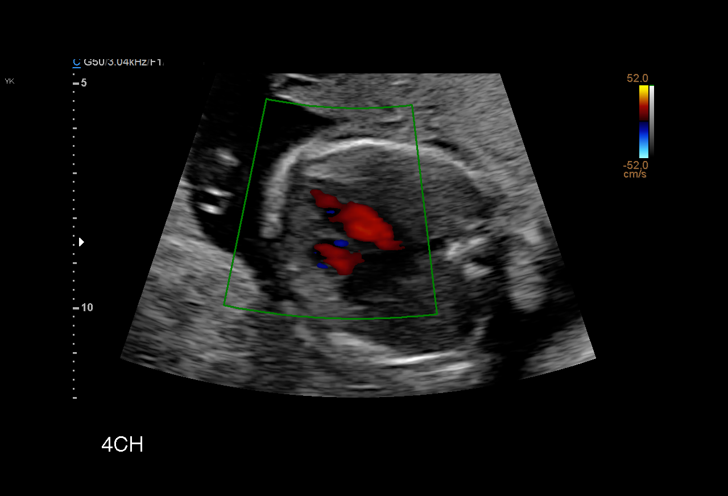
[im 34/65]
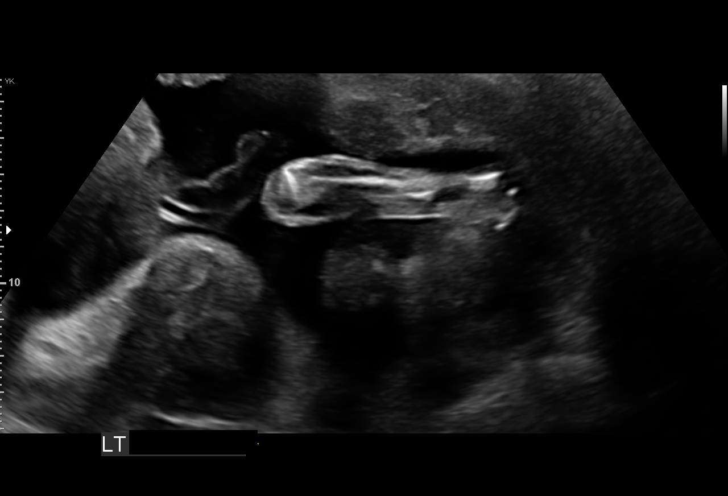
[im 38/65]
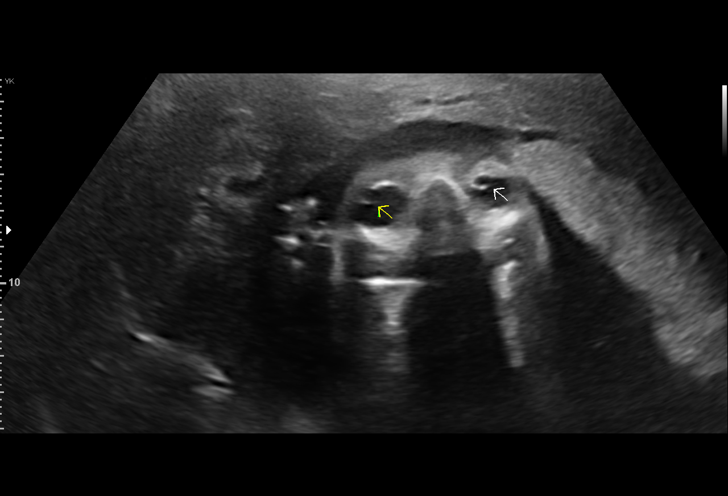
[im 43/65]
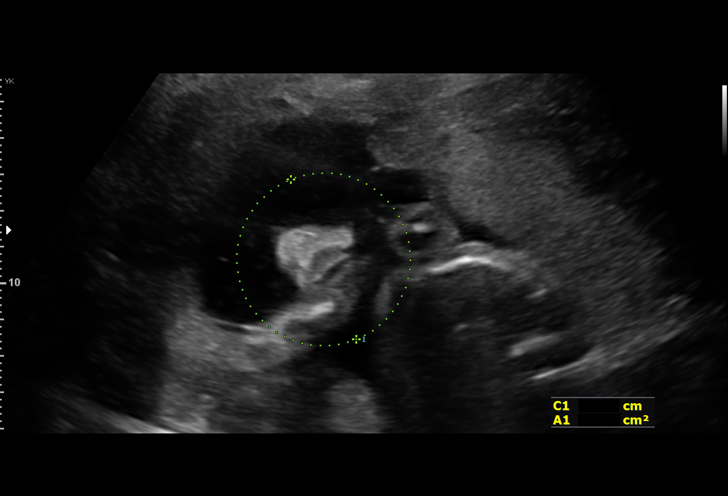
[im 48/65]
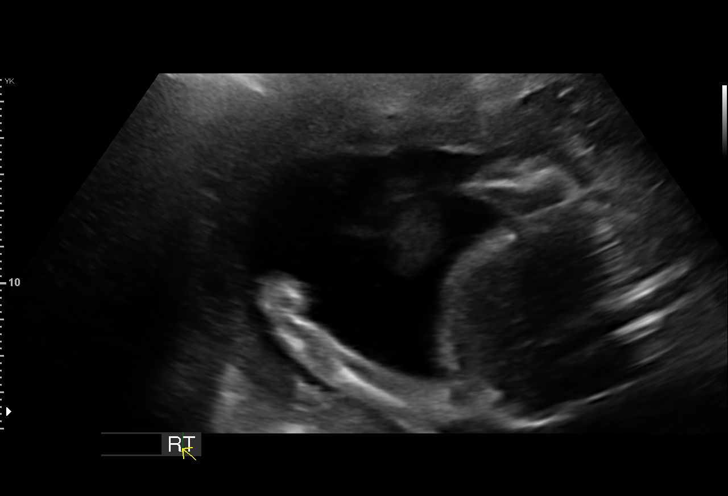
[im 53/65]
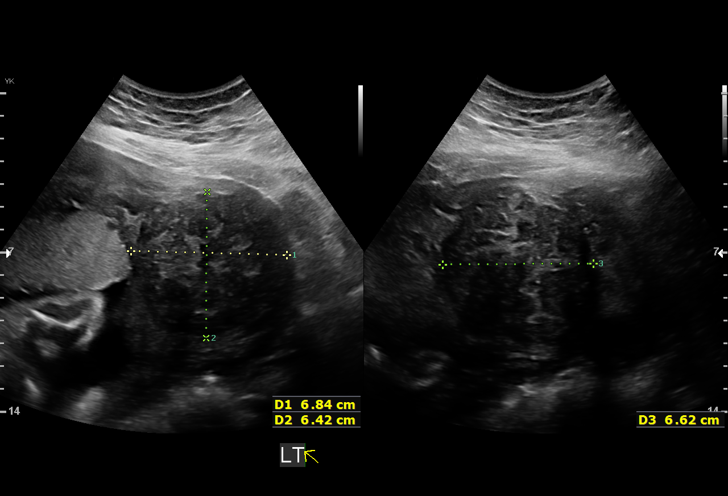
[im 57/65]
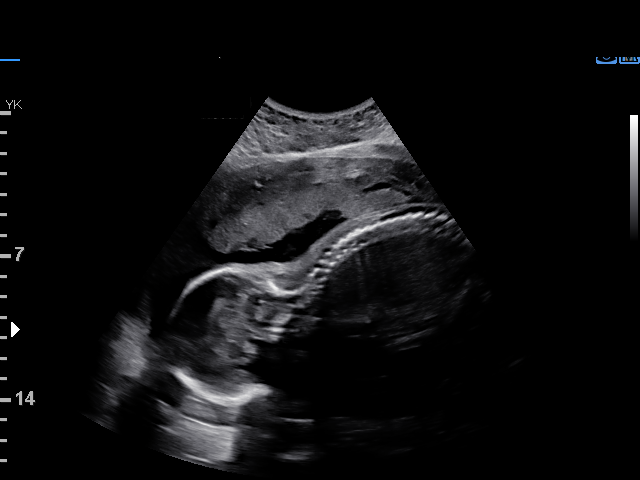
[im 62/65]
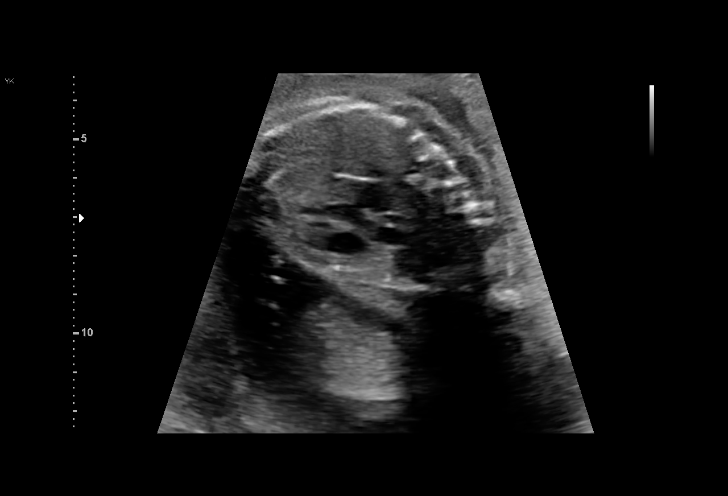

[13 of 28 positions shown; findings below may reference images not displayed]

----------------------------------------------------------------------

 ----------------------------------------------------------------------
Indications

  Antenatal follow-up for nonvisualized fetal
  anatomy
  Cervical cerclage suture present, second
  trimester (placed [DATE])
  Advanced maternal age multigravida 35+,
  second trimester
  Pregnancy resulting from assisted
  reproductive technology (IVF, low risk NIPS)
  Obesity complicating pregnancy, second
  trimester
  24 weeks gestation of pregnancy
 ----------------------------------------------------------------------
Fetal Evaluation

 Num Of Fetuses:         1
 Fetal Heart Rate(bpm):  154
 Cardiac Activity:       Observed
 Presentation:           Breech
 Placenta:               Anterior
 P. Cord Insertion:      Previously Visualized

 Amniotic Fluid
 AFI FV:      Within normal limits

                             Largest Pocket(cm)


 Comment:    Movements visualized
Biometry

 BPD:      59.3  mm     G. Age:  24w 2d         46  %    CI:        69.58   %    70 - 86
                                                         FL/HC:      18.6   %    18.7 -
 HC:      226.9  mm     G. Age:  24w 5d         53  %    HC/AC:      1.13        1.05 -
 AC:      201.4  mm     G. Age:  24w 5d         61  %    FL/BPD:     71.3   %    71 - 87
 FL:       42.3  mm     G. Age:  23w 6d         27  %    FL/AC:      21.0   %    20 - 24

 Est. FW:     690  gm      1 lb 8 oz     56  %
OB History

 Gravidity:    1
Gestational Age

 U/S Today:     24w 3d                                        EDD:   10/21/18
 Best:          24w 1d     Det. By:  Previous Ultrasound      EDD:   10/23/18
Anatomy

 Cranium:               Appears normal         Aortic Arch:            Appears normal
 Cavum:                 Appears normal         Ductal Arch:            Previously seen
 Ventricles:            Previously seen        Diaphragm:              Previously seen
 Choroid Plexus:        Previously seen        Stomach:                Appears normal, left
                                                                       sided
 Cerebellum:            Previously seen        Abdomen:                Appears normal
 Posterior Fossa:       Previously seen        Abdominal Wall:         Previously seen
 Nuchal Fold:           Not applicable (>20    Cord Vessels:           Previously seen
                        wks GA)
 Face:                  Appears normal         Kidneys:                Appear normal
                        (orbits and profile)
 Lips:                  Appears normal         Bladder:                Appears normal
 Thoracic:              Appears normal         Spine:                  Previously seen
 Heart:                 Appears normal         Upper Extremities:      Previously seen
                        (4CH, axis, and situs
 RVOT:                  Appears normal (3-     Lower Extremities:      Previously seen
                        VV)
 LVOT:                  Not well visualized

 Other:  Parents do not wish to know sex of fetus. Hands are well visualized.
         Heels previously visualized. Technically difficult due to maternal
         habitus and fetal position.
Myomas

  Site                     L(cm)      W(cm)      D(cm)      Location
  Left                     6.82       6.62       6.42       Intramural
  Right                    3.4        2.9                   Intramural
  Right                    3.4        3.4                   Intramural
 ----------------------------------------------------------------------

  Blood Flow                 RI        PI       Comments

 ----------------------------------------------------------------------
Impression

 Patient returned for completion of fetal anatomy. She had
 rescue cerclage at 18-weeks' gestation. She does not have
 symptoms of uterine contractions or vaginal bleeding.

 Fetal growth is appropriate for gestational age. Amniotic fluid
 is normal and good fetal activity is seen. Cardiac views are
 still suboptimal because of fetal position. On transabdominal
 scan, the cervical canal appears closed (transvaginal
 evaluation was not performed because of lack of symptoms).

 We reassured the patient of the findings.
 Follow-up scans may be performed at your office.
                 Kardash, Maroha

## 2019-04-13 IMAGING — US US MFM FETAL BPP W/O NON-STRESS
1 series · 13 of 28 positions shown · non-contrast
Comparison: none

[Series 1: us mfm fetal bpp w/o non-stress · 47 acquisitions, 13 frames shown]
[im 2/47]
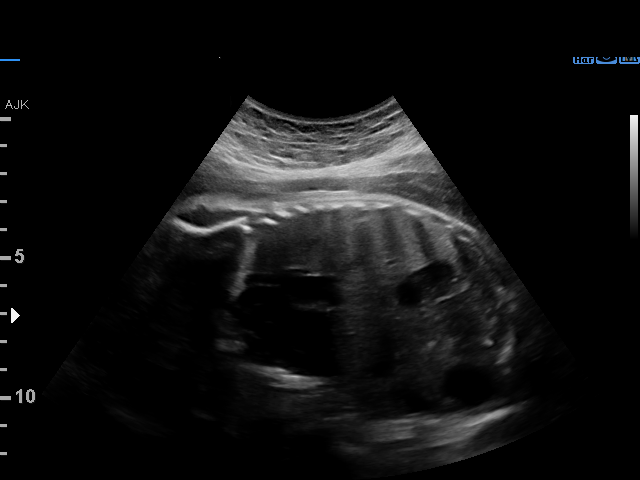
[im 6/47]
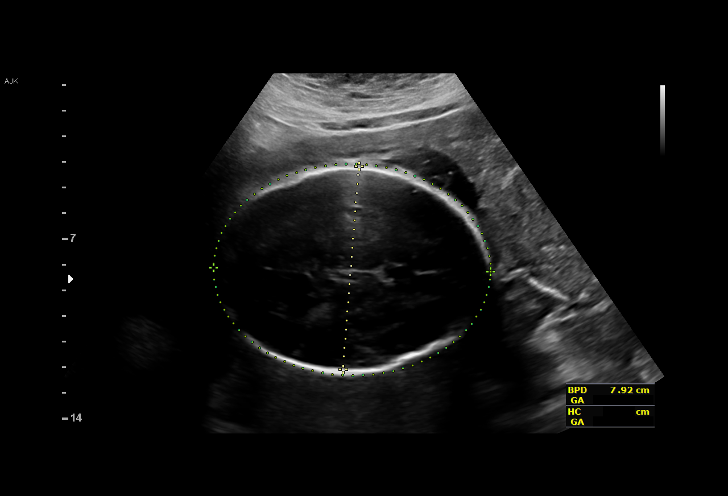
[im 9/47]
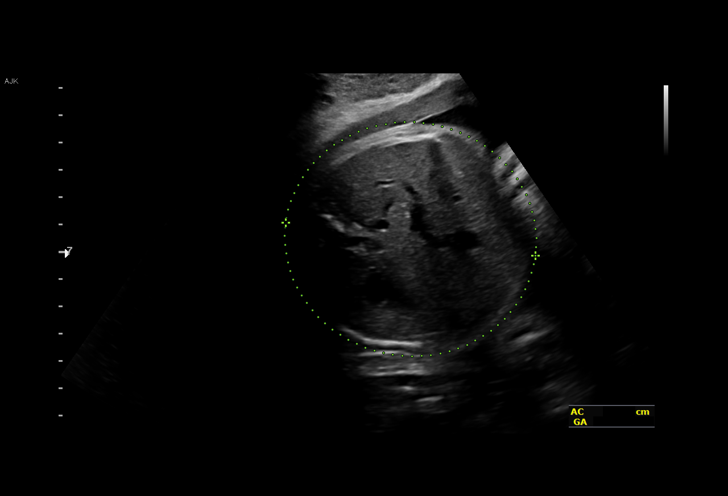
[im 12/47]
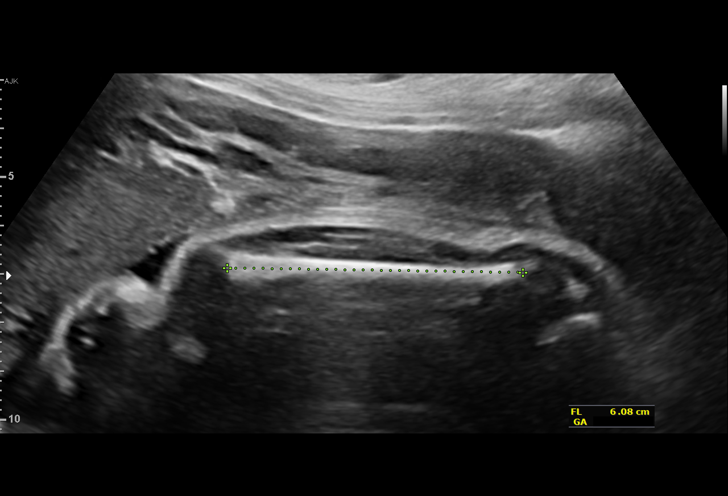
[im 16/47]
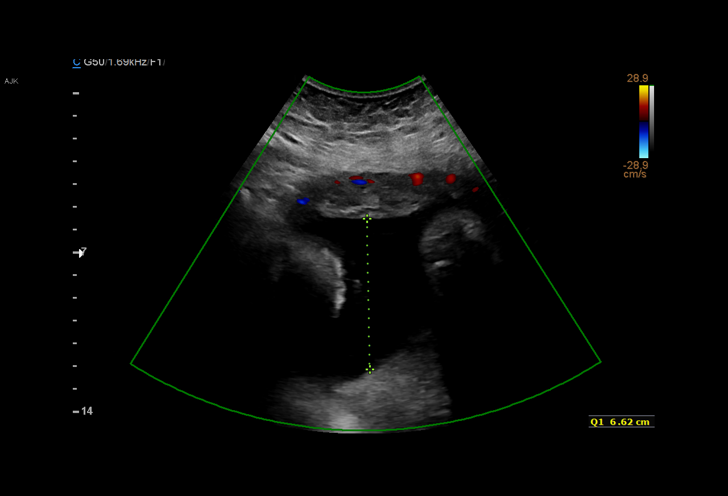
[im 19/47]
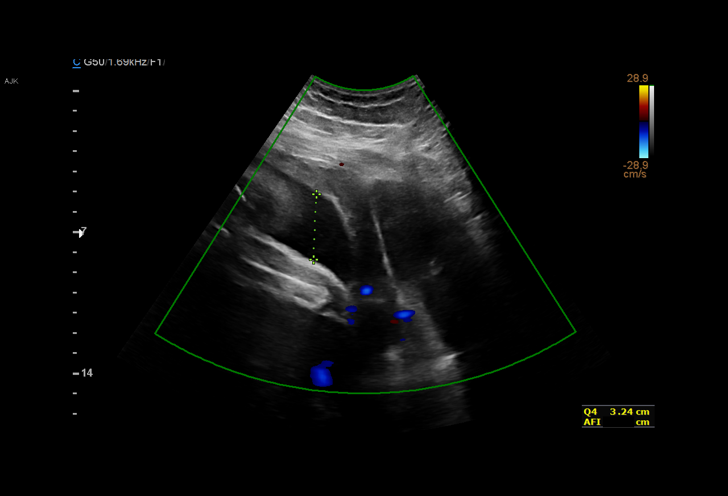
[im 24/47]
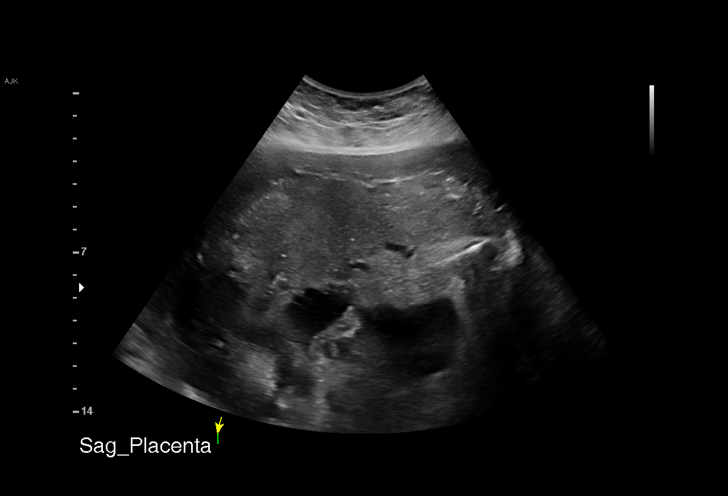
[im 28/47]
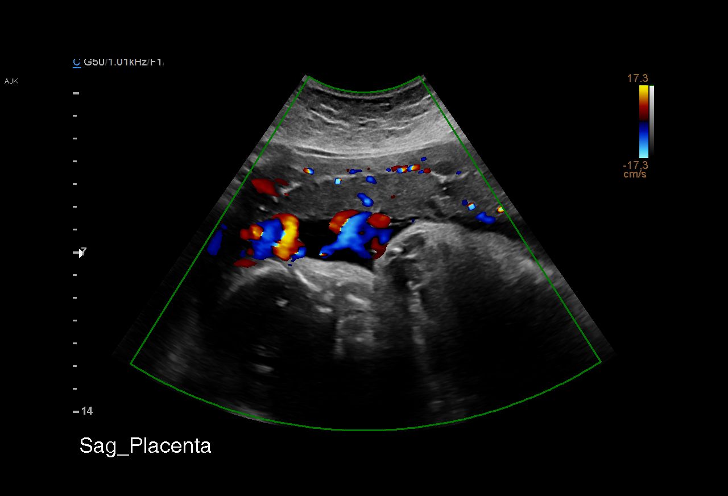
[im 31/47]
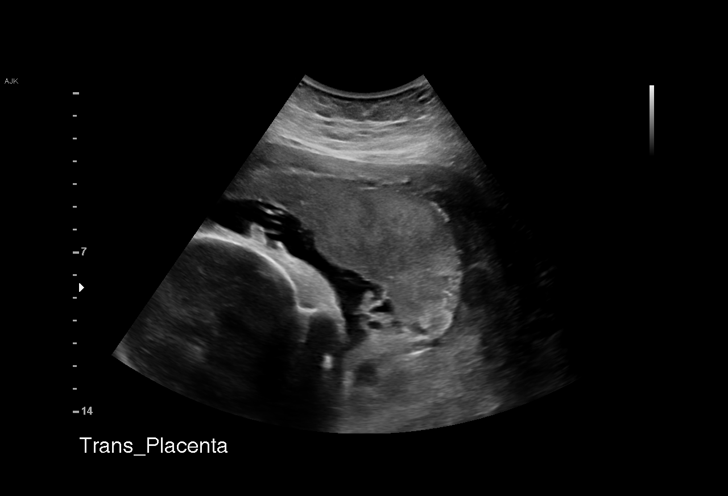
[im 35/47]
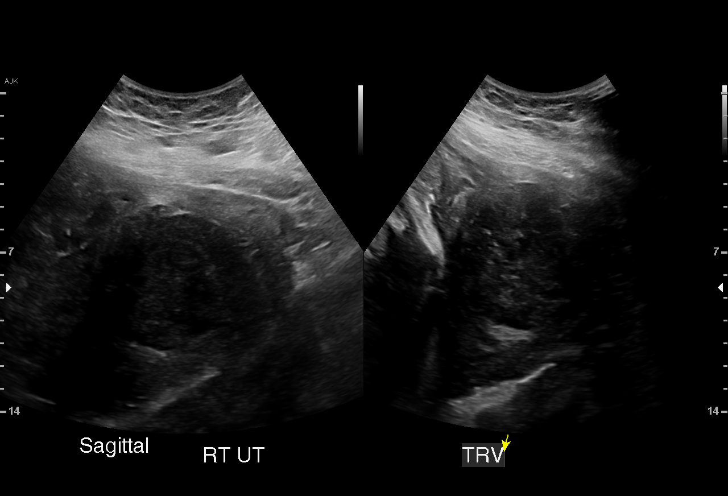
[im 38/47]
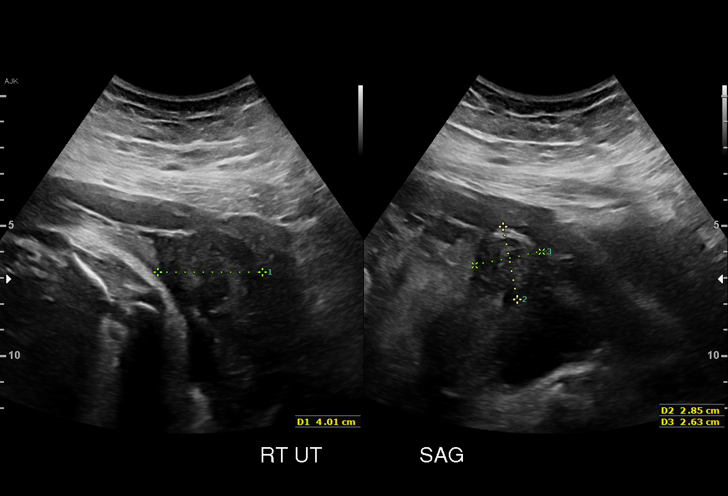
[im 41/47]
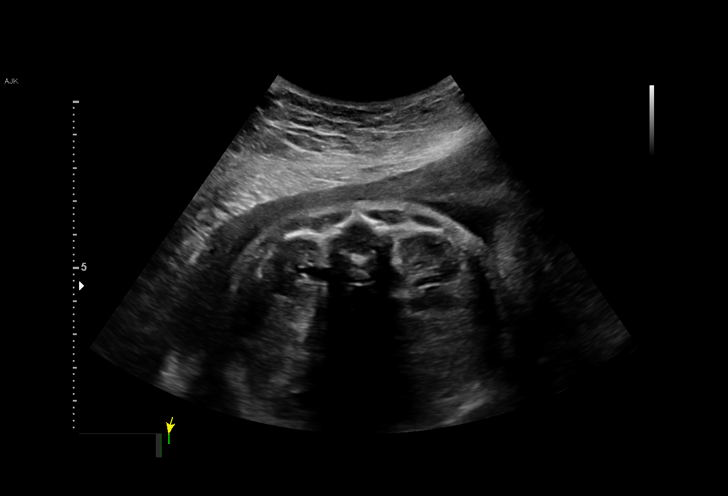
[im 45/47]
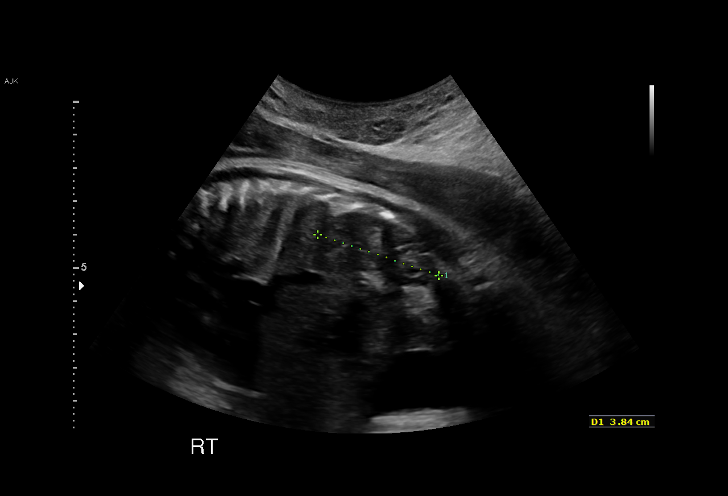

[13 of 28 positions shown; findings below may reference images not displayed]

----------------------------------------------------------------------

 ----------------------------------------------------------------------
Indications

  Pregnancy resulting from assisted
  reproductive technology (IVF, low risk NIPS)
  Obesity complicating pregnancy, third
  trimester
  Advanced maternal age multigravida 35+,
  third trimester
  Cervical cerclage suture present, third
  trimester
  31 weeks gestation of pregnancy
 ----------------------------------------------------------------------
Fetal Evaluation

 Num Of Fetuses:         1
 Fetal Heart Rate(bpm):  128
 Cardiac Activity:       Observed
 Presentation:           Breech
 Placenta:               Anterior
 P. Cord Insertion:      Visualized

 Amniotic Fluid
 AFI FV:      Within normal limits

 AFI Sum(cm)     %Tile       Largest Pocket(cm)
 14              47

 RUQ(cm)       RLQ(cm)       LUQ(cm)        LLQ(cm)

Biophysical Evaluation
 Amniotic F.V:   Within normal limits       F. Tone:        Observed
 F. Movement:    Observed                   Score:          [DATE]
 F. Breathing:   Not Observed
Biometry

 BPD:      79.7  mm     G. Age:  32w 0d         49  %    CI:        70.07   %    70 - 86
                                                         FL/HC:      20.0   %    19.1 -
 HC:      303.7  mm     G. Age:  33w 5d         69  %    HC/AC:      1.09        0.96 -
 AC:      278.1  mm     G. Age:  31w 6d         53  %    FL/BPD:     76.3   %    71 - 87
 FL:       60.8  mm     G. Age:  31w 4d         34  %    FL/AC:      21.9   %    20 - 24

 Est. FW:    3466  gm      4 lb 2 oz     61  %
OB History

 Gravidity:    1
Gestational Age

 U/S Today:     32w 2d                                        EDD:   10/19/18
 Best:          31w 5d     Det. By:  Previous Ultrasound      EDD:   10/23/18
Anatomy

 Cranium:               Appears normal         Aortic Arch:            Previously seen
 Cavum:                 Appears normal         Ductal Arch:            Previously seen
 Ventricles:            Previously seen        Diaphragm:              Appears normal
 Choroid Plexus:        Previously seen        Stomach:                Appears normal, left
                                                                       sided
 Cerebellum:            Previously seen        Abdomen:                Appears normal
 Posterior Fossa:       Previously seen        Abdominal Wall:         Previously seen
 Nuchal Fold:           Not applicable (>20    Cord Vessels:           Previously seen
                        wks GA)
 Face:                  Orbits and profile     Kidneys:                Appear normal
                        previously seen
 Lips:                  Previously seen        Bladder:                Appears normal
 Thoracic:              Appears normal         Spine:                  Previously seen
 Heart:                 Previously seen        Upper Extremities:      Previously seen
 RVOT:                  Previously seen        Lower Extremities:      Previously seen
 LVOT:                  Not well visualized

 Other:  Parents do not wish to know sex of fetus. Hands are well visualized.
         Heels previously visualized. Technically difficult due to maternal
         habitus and fetal position.
Cervix Uterus Adnexa

 Uterus
 Multiple fibroids noted, see table below.
Myomas

  Site                     L(cm)      W(cm)      D(cm)      Location
  LUS
  Right                    2.9        2.6        4
  Left
 ----------------------------------------------------------------------
  Blood Flow                 RI        PI       Comments

 ----------------------------------------------------------------------
Impression

 On ultrasound, amniotic fluid is normal and good fetal activity
 is seen. Breech presentation. Fetal growth is appropriate for
 gestational age. Fetal breathing movements did not meet the
 crieteria. BPP (including NST) is [DATE] (reassuring).
 Intramural myomas are seen (see above).
 x[id number]
 See consultation note in [REDACTED]
Recommendations

 -BPP on 08/29/2018 and then weekly.
                 Yager, Toshinobu

## 2020-06-03 ENCOUNTER — Other Ambulatory Visit: Payer: Self-pay | Admitting: Obstetrics and Gynecology

## 2020-06-03 DIAGNOSIS — Z7689 Persons encountering health services in other specified circumstances: Secondary | ICD-10-CM

## 2020-06-03 DIAGNOSIS — R110 Nausea: Secondary | ICD-10-CM
